# Patient Record
Sex: Female | Born: 1960 | Race: White | Hispanic: No | Marital: Married | Smoking: Never smoker
Health system: Southern US, Community
[De-identification: ages and names within clinical notes are randomized; demographics above are authoritative.]

## PROBLEM LIST (undated history)

## (undated) DIAGNOSIS — F419 Anxiety disorder, unspecified: Secondary | ICD-10-CM

## (undated) DIAGNOSIS — O009 Unspecified ectopic pregnancy without intrauterine pregnancy: Secondary | ICD-10-CM

## (undated) DIAGNOSIS — M722 Plantar fascial fibromatosis: Secondary | ICD-10-CM

## (undated) DIAGNOSIS — F329 Major depressive disorder, single episode, unspecified: Secondary | ICD-10-CM

## (undated) DIAGNOSIS — F32A Depression, unspecified: Secondary | ICD-10-CM

## (undated) DIAGNOSIS — F429 Obsessive-compulsive disorder, unspecified: Secondary | ICD-10-CM

## (undated) DIAGNOSIS — L409 Psoriasis, unspecified: Secondary | ICD-10-CM

## (undated) DIAGNOSIS — Z803 Family history of malignant neoplasm of breast: Secondary | ICD-10-CM

## (undated) HISTORY — DX: Psoriasis, unspecified: L40.9

## (undated) HISTORY — DX: Major depressive disorder, single episode, unspecified: F32.9

## (undated) HISTORY — DX: Family history of malignant neoplasm of breast: Z80.3

## (undated) HISTORY — DX: Depression, unspecified: F32.A

## (undated) HISTORY — PX: WISDOM TOOTH EXTRACTION: SHX21

## (undated) HISTORY — DX: Unspecified ectopic pregnancy without intrauterine pregnancy: O00.90

## (undated) HISTORY — DX: Obsessive-compulsive disorder, unspecified: F42.9

---
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
## 1992-10-30 DIAGNOSIS — O009 Unspecified ectopic pregnancy without intrauterine pregnancy: Secondary | ICD-10-CM

## 1992-10-30 HISTORY — DX: Unspecified ectopic pregnancy without intrauterine pregnancy: O00.90

## 1992-10-30 HISTORY — PX: ECTOPIC PREGNANCY SURGERY: SHX613

## 1998-11-04 ENCOUNTER — Other Ambulatory Visit: Admission: RE | Admit: 1998-11-04 | Discharge: 1998-11-04 | Payer: Self-pay | Admitting: *Deleted

## 2000-02-22 ENCOUNTER — Other Ambulatory Visit: Admission: RE | Admit: 2000-02-22 | Discharge: 2000-02-22 | Payer: Self-pay | Admitting: *Deleted

## 2001-03-04 ENCOUNTER — Other Ambulatory Visit: Admission: RE | Admit: 2001-03-04 | Discharge: 2001-03-04 | Payer: Self-pay | Admitting: Obstetrics and Gynecology

## 2002-04-16 ENCOUNTER — Other Ambulatory Visit: Admission: RE | Admit: 2002-04-16 | Discharge: 2002-04-16 | Payer: Self-pay | Admitting: Obstetrics and Gynecology

## 2003-05-27 ENCOUNTER — Other Ambulatory Visit: Admission: RE | Admit: 2003-05-27 | Discharge: 2003-05-27 | Payer: Self-pay | Admitting: Obstetrics and Gynecology

## 2004-06-30 ENCOUNTER — Other Ambulatory Visit: Admission: RE | Admit: 2004-06-30 | Discharge: 2004-06-30 | Payer: Self-pay | Admitting: Obstetrics and Gynecology

## 2005-08-11 ENCOUNTER — Other Ambulatory Visit: Admission: RE | Admit: 2005-08-11 | Discharge: 2005-08-11 | Payer: Self-pay | Admitting: Obstetrics and Gynecology

## 2006-10-11 ENCOUNTER — Ambulatory Visit: Payer: Self-pay | Admitting: Obstetrics & Gynecology

## 2006-10-30 HISTORY — PX: CHOLECYSTECTOMY: SHX55

## 2007-02-06 ENCOUNTER — Emergency Department: Payer: Self-pay | Admitting: Emergency Medicine

## 2007-02-22 ENCOUNTER — Ambulatory Visit: Payer: Self-pay | Admitting: General Surgery

## 2007-11-26 ENCOUNTER — Ambulatory Visit: Payer: Self-pay | Admitting: Obstetrics & Gynecology

## 2009-03-16 ENCOUNTER — Ambulatory Visit: Payer: Self-pay

## 2009-03-23 ENCOUNTER — Ambulatory Visit: Payer: Self-pay

## 2010-04-01 ENCOUNTER — Ambulatory Visit: Payer: Self-pay

## 2011-05-26 ENCOUNTER — Ambulatory Visit: Payer: Self-pay | Admitting: Unknown Physician Specialty

## 2011-10-31 DIAGNOSIS — Z803 Family history of malignant neoplasm of breast: Secondary | ICD-10-CM

## 2011-10-31 HISTORY — DX: Family history of malignant neoplasm of breast: Z80.3

## 2012-05-31 ENCOUNTER — Ambulatory Visit: Payer: Self-pay

## 2013-06-03 ENCOUNTER — Ambulatory Visit: Payer: Self-pay

## 2013-07-26 LAB — HM COLONOSCOPY

## 2014-01-21 ENCOUNTER — Encounter: Payer: Self-pay | Admitting: Podiatry

## 2014-01-21 ENCOUNTER — Ambulatory Visit (INDEPENDENT_AMBULATORY_CARE_PROVIDER_SITE_OTHER): Payer: BC Managed Care – PPO

## 2014-01-21 ENCOUNTER — Ambulatory Visit (INDEPENDENT_AMBULATORY_CARE_PROVIDER_SITE_OTHER): Payer: BC Managed Care – PPO | Admitting: Podiatry

## 2014-01-21 VITALS — BP 129/83 | HR 71 | Resp 18 | Ht 63.0 in | Wt 147.0 lb

## 2014-01-21 DIAGNOSIS — M722 Plantar fascial fibromatosis: Secondary | ICD-10-CM

## 2014-01-21 MED ORDER — METHYLPREDNISOLONE (PAK) 4 MG PO TABS: ORAL_TABLET | ORAL | Status: DC

## 2014-01-21 MED ORDER — MELOXICAM 15 MG PO TABS: 15.0000 mg | ORAL_TABLET | Freq: Every day | ORAL | Status: DC

## 2014-01-21 NOTE — Progress Notes (Signed)
   Subjective:    Patient ID: Natasha HanksStacy Deeley, female    DOB: [birth year], 53 y.o.   MRN: [id number]  HPI Comments: Right foot and heel pain. It is a throb to a dull ache . Started in November. Mornings can be worse. i run three to four times a week. Ive changed shoes rolled foot on a golf ball  Foot Pain Associated symptoms include coughing and a sore throat.      Review of Systems  HENT: Positive for sore throat.   Respiratory: Positive for cough.   Gastrointestinal: Positive for constipation.  All other systems reviewed and are negative.       Objective:   Physical Exam: I have reviewed her past mental history medications allergies surgeries and social history. Review systems is unremarkable. Pulses are strongly palpable bilateral. Muscle strength is 5 over 5 dorsiflexors plantar flexors inverters everters all intrinsic musculature is intact. Neurologic sensorium is intact per Semmes-Weinstein monofilament. Deep tendon reflexes are intact bilateral and brisk. Orthopedic evaluation demonstrates all joints distal to the ankle a full range of motion without crepitation. She has pain on palpation of the plantar medial calcaneal tubercle at the plantar fascial calcaneal insertion site right foot. Cutaneous evaluation demonstrates no erythema edema cellulitis drainage or odor.  Radiographic evaluation does demonstrate a soft tissue increase in density at the plantar fascial calcaneal insertion site.        Assessment & Plan:  Assessment: Plantar fasciitis right.  Plan: Discussed etiology pathology conservative versus surgical therapies. Injected the right heel today with Kenalog and local anesthetic. Wrote her prescription for Medrol Dosepak to be followed by Monday. Put her in a plantar fascial brace to be worn daily. She was provided with a plantar fascial night splint to be worn at night time. We discussed appropriate shoe gear stretching exercises ice therapy and shoe gear modifications.  I will followup with her in one month.

## 2014-01-28 HISTORY — PX: COLONOSCOPY: SHX174

## 2014-02-18 ENCOUNTER — Encounter: Payer: Self-pay | Admitting: Podiatry

## 2014-02-18 ENCOUNTER — Ambulatory Visit (INDEPENDENT_AMBULATORY_CARE_PROVIDER_SITE_OTHER): Payer: BC Managed Care – PPO | Admitting: Podiatry

## 2014-02-18 VITALS — BP 137/85 | HR 69 | Resp 16

## 2014-02-18 DIAGNOSIS — M722 Plantar fascial fibromatosis: Secondary | ICD-10-CM

## 2014-02-18 NOTE — Progress Notes (Signed)
She presents today for followup of her plantar fasciitis she states it is anywhere from 6070% improved. She doesn't have pain in the morning when she wakes up anymore however it does get tired throughout the day. She really can't say whether she has pain at some point every day or not.  Objective: Vital signs are stable she is alert and oriented x3. Pulses are strongly palpable right foot. She has pain on palpation medial continued tubercle of the right heel much less than previously noted.  Assessment: Residual plantar fasciitis right foot.  Plan: Injected the right heel again today continue all conservative therapies consider orthotics next visit

## 2014-03-04 ENCOUNTER — Telehealth: Payer: Self-pay | Admitting: *Deleted

## 2014-03-04 NOTE — Telephone Encounter (Signed)
Please call.

## 2014-03-04 NOTE — Telephone Encounter (Signed)
Have been having more pain in the left foot now. Wearing sneakers and still exercising. She thought that maybe switching up tennis shoes and taking a break from exercising will help. i agreed with Stellar . And she is going to try to do that

## 2014-03-04 NOTE — Telephone Encounter (Signed)
Called and left message for pt to return call. i do not know why pt called.

## 2014-03-19 ENCOUNTER — Ambulatory Visit: Payer: BC Managed Care – PPO | Admitting: Podiatry

## 2014-07-08 ENCOUNTER — Ambulatory Visit: Payer: Self-pay

## 2015-06-27 ENCOUNTER — Emergency Department (HOSPITAL_COMMUNITY)
Admission: EM | Admit: 2015-06-27 | Discharge: 2015-06-28 | Disposition: A | Discharge status: Other Acute Inpatient Hospital | Payer: BC Managed Care – PPO | Attending: Emergency Medicine | Admitting: Emergency Medicine

## 2015-06-27 ENCOUNTER — Encounter (HOSPITAL_COMMUNITY): Payer: Self-pay | Admitting: Nurse Practitioner

## 2015-06-27 DIAGNOSIS — R45851 Suicidal ideations: Secondary | ICD-10-CM | POA: Diagnosis present

## 2015-06-27 DIAGNOSIS — F1310 Sedative, hypnotic or anxiolytic abuse, uncomplicated: Secondary | ICD-10-CM | POA: Insufficient documentation

## 2015-06-27 DIAGNOSIS — F329 Major depressive disorder, single episode, unspecified: Secondary | ICD-10-CM | POA: Diagnosis not present

## 2015-06-27 DIAGNOSIS — Z791 Long term (current) use of non-steroidal anti-inflammatories (NSAID): Secondary | ICD-10-CM | POA: Diagnosis not present

## 2015-06-27 DIAGNOSIS — Z3202 Encounter for pregnancy test, result negative: Secondary | ICD-10-CM | POA: Diagnosis not present

## 2015-06-27 HISTORY — DX: Plantar fascial fibromatosis: M72.2

## 2015-06-27 HISTORY — DX: Anxiety disorder, unspecified: F41.9

## 2015-06-27 LAB — COMPREHENSIVE METABOLIC PANEL
ALT: 15 U/L (ref 14–54)
ANION GAP: 6 (ref 5–15)
AST: 20 U/L (ref 15–41)
Albumin: 4 g/dL (ref 3.5–5.0)
Alkaline Phosphatase: 47 U/L (ref 38–126)
BUN: 16 mg/dL (ref 6–20)
CHLORIDE: 105 mmol/L (ref 101–111)
CO2: 31 mmol/L (ref 22–32)
Calcium: 10.3 mg/dL (ref 8.9–10.3)
Creatinine, Ser: 0.98 mg/dL (ref 0.44–1.00)
GFR calc non Af Amer: >60 mL/min (ref >60)
Glucose, Bld: 91 mg/dL (ref 65–99)
Potassium: 4.2 mmol/L (ref 3.5–5.1)
SODIUM: 142 mmol/L (ref 135–145)
Total Bilirubin: 0.2 mg/dL — ABNORMAL LOW (ref 0.3–1.2)
Total Protein: 7.5 g/dL (ref 6.5–8.1)

## 2015-06-27 LAB — CBC
HCT: 41.7 % (ref 36.0–46.0)
HEMOGLOBIN: 14 g/dL (ref 12.0–15.0)
MCH: 29.1 pg (ref 26.0–34.0)
MCHC: 33.6 g/dL (ref 30.0–36.0)
MCV: 86.7 fL (ref 78.0–100.0)
Platelets: 222 10*3/uL (ref 150–400)
RBC: 4.81 MIL/uL (ref 3.87–5.11)
RDW: 13.2 % (ref 11.5–15.5)
WBC: 5.5 10*3/uL (ref 4.0–10.5)

## 2015-06-27 LAB — RAPID URINE DRUG SCREEN, HOSP PERFORMED
AMPHETAMINES: NOT DETECTED
Barbiturates: NOT DETECTED
Benzodiazepines: POSITIVE — AB
COCAINE: NOT DETECTED
OPIATES: NOT DETECTED
TETRAHYDROCANNABINOL: NOT DETECTED

## 2015-06-27 LAB — SALICYLATE LEVEL

## 2015-06-27 LAB — ETHANOL: Alcohol, Ethyl (B): <5 mg/dL (ref <5)

## 2015-06-27 LAB — I-STAT BETA HCG BLOOD, ED (MC, WL, AP ONLY): I-stat hCG, quantitative: <5 m[IU]/mL (ref <5)

## 2015-06-27 LAB — ACETAMINOPHEN LEVEL

## 2015-06-27 MED ORDER — ONDANSETRON HCL 4 MG PO TABS: 4.0000 mg | ORAL_TABLET | Freq: Three times a day (TID) | ORAL | Status: DC | PRN

## 2015-06-27 MED ORDER — ACETAMINOPHEN 325 MG PO TABS: 650.0000 mg | ORAL_TABLET | ORAL | Status: DC | PRN

## 2015-06-27 MED ORDER — FLUVOXAMINE MALEATE 100 MG PO TABS
100.0000 mg | ORAL_TABLET | Freq: Every day | ORAL | Status: DC
  Administered 2015-06-27: 100 mg via ORAL
  Filled 2015-06-27 (×3): qty 1

## 2015-06-27 MED ORDER — ALPRAZOLAM 0.5 MG PO TABS
0.5000 mg | ORAL_TABLET | Freq: Two times a day (BID) | ORAL | Status: DC
  Administered 2015-06-27 – 2015-06-28 (×2): 0.5 mg via ORAL
  Filled 2015-06-27 (×2): qty 1

## 2015-06-27 MED ORDER — BUSPIRONE HCL 10 MG PO TABS
30.0000 mg | ORAL_TABLET | Freq: Two times a day (BID) | ORAL | Status: DC
  Administered 2015-06-27 – 2015-06-28 (×2): 30 mg via ORAL
  Filled 2015-06-27 (×2): qty 3

## 2015-06-27 MED ORDER — VENLAFAXINE HCL ER 37.5 MG PO CP24
37.5000 mg | ORAL_CAPSULE | Freq: Every day | ORAL | Status: DC
  Filled 2015-06-27: qty 1

## 2015-06-27 MED ORDER — IBUPROFEN 400 MG PO TABS: 600.0000 mg | ORAL_TABLET | Freq: Three times a day (TID) | ORAL | Status: DC | PRN

## 2015-06-27 MED ORDER — ALUM & MAG HYDROXIDE-SIMETH 200-200-20 MG/5ML PO SUSP: 30.0000 mL | ORAL | Status: DC | PRN

## 2015-06-27 MED ORDER — ZOLPIDEM TARTRATE 5 MG PO TABS: 5.0000 mg | ORAL_TABLET | Freq: Every evening | ORAL | Status: DC | PRN

## 2015-06-27 NOTE — ED Notes (Signed)
TTS being performed.  

## 2015-06-27 NOTE — Progress Notes (Signed)
Counselor consulted with Serena Colonel, NP who states that patient meets criteria for inpatient treatment at this time. TTS to seek placement.

## 2015-06-27 NOTE — ED Notes (Signed)
Dinner tray arrived, patient eating; sitter at bedside 

## 2015-06-27 NOTE — ED Notes (Addendum)
Pt arrived to [facility] - dressed in maroon colored scrubs. States Shugart w/[facility] is her psych - last visit was 06/16/15 - states Xanax dosage was changed. States has SI plan - OD or sit in running vehicle in garage. 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

## 2015-06-27 NOTE — Progress Notes (Signed)
Received report from RN. RN states patient is being moved to Pod C. Counselor request that RN notify RN in [place] C to contact [facility] once patient is moved in order to initiate assessment.

## 2015-06-27 NOTE — ED Notes (Signed)
Patient placed in paper scrubs. Patient wanded by security.

## 2015-06-27 NOTE — BH Assessment (Signed)
Tele Assessment Note   Natasha Kennedy is an 54 y.o. female who presents voluntarily to [facility] emergency department with the chief complaint of suicidal ideations and plan to overdose or sit in garage with vehicle running. Patient is a 1st grade teacher and reports severe anxiety in regard to school starting tomorrow and not feeling fully prepared in regard to lesson plans and other additional requirements. Patient shares that she is currently connected with an outpatient psychiatric provider by the name of Anne Fu and states that she is currently taking Buspar, Xanax, and Luvox. Patient reports that her Xanax was recently changed during her outpatient appointment on August 17th from 0.25mg  to 0.50mg  twice a day. Patient shares that this medication adjustment provided no alleviation of her anxiety and depressive symptoms. Patient reports that she has been experiencing thoughts of suicide for the past 2 weeks. Patient does endorse a decrease appetite AEB a 14 pound weight lost since June 2016 in addition to disturbances within her sleep patterns. Patient denies any previous inpatient treatment at this time and identifies her husband to be a source of support. Patient denies HI/AVH.    Axis I: Generalized Anxiety Disorder, Major Depression, Recurrent severe and Obsessive Compulsive Disorder  Past Medical History:  Past Medical History  Diagnosis Date  . OCD (obsessive compulsive disorder)   . Depression   . Anxiety   . Plantar fasciitis of left foot     Past Surgical History  Procedure Laterality Date  . Cholecystectomy    . Abdominal surgery    . Rhinoplasty      Family History: History reviewed. No pertinent family history.  Social History:  reports that she has never smoked. She has never used smokeless tobacco. She reports that she does not drink alcohol or use illicit drugs.  Additional Social History:  Alcohol / Drug Use History of alcohol / drug use?: No history of alcohol /  drug abuse  CIWA: CIWA-Ar BP: 145/95 mmHg Pulse Rate: (!) 56 COWS:    PATIENT STRENGTHS: (choose at least two) Ability for insight Special hobby/interest  Allergies: No Known Allergies  Home Medications:  (Not in a hospital admission)  OB/GYN Status:  Patient's last menstrual period was 01/29/2015.  General Assessment Data Location of Assessment: [facility] ED TTS Assessment: In system Is this a Tele or Face-to-Face Assessment?: Tele Assessment Is this an Initial Assessment or a Re-assessment for this encounter?: Initial Assessment Marital status: Married Is patient pregnant?: No Pregnancy Status: No Living Arrangements: Spouse/significant other Can pt return to current living arrangement?: Yes Admission Status: Voluntary Is patient capable of signing voluntary admission?: Yes Referral Source: Self/Family/Friend     Crisis Care Plan Living Arrangements: Spouse/significant other Name of Psychiatrist: Anne Fu Name of Therapist: None   Education Status Is patient currently in school?: No  Risk to self with the past 6 months Suicidal Ideation: Yes-Currently Present Has patient been a risk to self within the past 6 months prior to admission? : No Suicidal Intent: Yes-Currently Present Has patient had any suicidal intent within the past 6 months prior to admission? : No Is patient at risk for suicide?: Yes Suicidal Plan?: No-Not Currently/Within Last 6 Months Has patient had any suicidal plan within the past 6 months prior to admission? : No Access to Means: Yes Specify Access to Suicidal Means: Access to pills What has been your use of drugs/alcohol within the last 12 months?: None Previous Attempts/Gestures: No How many times?: 0 Other Self Harm Risks: None Triggers  for Past Attempts: None known Intentional Self Injurious Behavior: None Family Suicide History: No Recent stressful life event(s): Other (Comment) (Job related stress) Persecutory voices/beliefs?:  No Depression: Yes Depression Symptoms: Feeling worthless/self pity, Fatigue, Tearfulness, Insomnia Substance abuse history and/or treatment for substance abuse?: No Suicide prevention information given to non-admitted patients: Not applicable  Risk to Others within the past 6 months Homicidal Ideation: No Does patient have any lifetime risk of violence toward others beyond the six months prior to admission? : No Thoughts of Harm to Others: No Current Homicidal Intent: No Current Homicidal Plan: No Access to Homicidal Means: No Identified Victim: None History of harm to others?: No Assessment of Violence: None Noted Violent Behavior Description: Patient is calm and cooperative Does patient have access to weapons?: No Criminal Charges Pending?: No Does patient have a court date: No Is patient on probation?: No  Psychosis Hallucinations: None noted Delusions: None noted  Mental Status Report Appearance/Hygiene: In hospital gown Eye Contact: Good Motor Activity: Unremarkable Speech: Logical/coherent Level of Consciousness: Alert Mood: Depressed, Anxious, Helpless Affect: Depressed, Sad Anxiety Level: Severe Thought Processes: Coherent, Relevant Judgement: Partial Orientation: Person, Place, Time, Situation Obsessive Compulsive Thoughts/Behaviors: None  Cognitive Functioning Concentration: Decreased Memory: Recent Intact, Remote Intact IQ: Average Insight: Fair Impulse Control: Fair Appetite: Poor Weight Loss: 14 Weight Gain: 0 Sleep: Decreased Total Hours of Sleep: 7 Vegetative Symptoms: None  ADLScreening [facility] Assessment Services) Patient's cognitive ability adequate to safely complete daily activities?: Yes Patient able to express need for assistance with ADLs?: Yes Independently performs ADLs?: Yes (appropriate for developmental age)  Prior Inpatient Therapy Prior Inpatient Therapy: No  Prior Outpatient Therapy Prior Outpatient Therapy: Yes Prior Therapy  Dates: Current Prior Therapy Facilty/Provider(s): Anne Fu Reason for Treatment: Medication mgmt Does patient have an ACCT team?: No Does patient have Intensive In-House Services?  : No Does patient have [facility] services? : No Does patient have P4CC services?: No  ADL Screening (condition at time of admission) Patient's cognitive ability adequate to safely complete daily activities?: Yes Is the patient deaf or have difficulty hearing?: No Does the patient have difficulty seeing, even when wearing glasses/contacts?: No Does the patient have difficulty concentrating, remembering, or making decisions?: No Patient able to express need for assistance with ADLs?: Yes Does the patient have difficulty dressing or bathing?: No Independently performs ADLs?: Yes (appropriate for developmental age) Does the patient have difficulty walking or climbing stairs?: No Weakness of Legs: None Weakness of Arms/Hands: None  Home Assistive Devices/Equipment Home Assistive Devices/Equipment: None  Therapy Consults (therapy consults require a physician order) PT Evaluation Needed: No OT Evalulation Needed: No SLP Evaluation Needed: No Abuse/Neglect Assessment (Assessment to be complete while patient is alone) Physical Abuse: Denies Verbal Abuse: Denies Sexual Abuse: Denies Exploitation of patient/patient's resources: Denies Self-Neglect: Denies Values / Beliefs Cultural Requests During Hospitalization: None Spiritual Requests During Hospitalization: None Consults Spiritual Care Consult Needed: No Social Work Consult Needed: No Merchant navy officer (For Healthcare) Does patient have an advance directive?: No Would patient like information on creating an advanced directive?: No - patient declined information    Additional Information 1:1 In Past 12 Months?: No CIRT Risk: No Elopement Risk: No Does patient have medical clearance?: Yes     Disposition:  Disposition Initial Assessment  Completed for this Encounter: Yes  PICKETT JR, Timonthy Hovater C 06/27/2015 5:22 PM

## 2015-06-27 NOTE — ED Notes (Signed)
Called service response and ordered patient a dinner tray

## 2015-06-27 NOTE — BH Assessment (Signed)
Contacted the following facilities for placement:  AT [facility]: [facility], per [facility], per [facility], per [facility], per [facility], per [facility], per [facility] per [facility], per [facility], per [facility], per [facility], per [facility], per [facility], per [facility], per [facility] per [facility], per [facility] per [facility], per [facility], per [facility], per [facility], per [facility], per [facility], [facility], [facility], [facility] Triage Specialist [phone]

## 2015-06-27 NOTE — ED Notes (Signed)
She states she has been having suicidal thoughts for 2 weeks, she has medications for depression/anxiety that she has been taking as scheduled. She reports she did think of a plan for suicide. Denies any thoughts of harming others. She denies any substance use, physical complaints. She is A&Ox4, resp e/u

## 2015-06-27 NOTE — ED Notes (Signed)
Ps given graham crackers and a sprite with ice.

## 2015-06-27 NOTE — ED Notes (Signed)
Natasha Kennedy, [facility], completing TTS via phone d/t telepsych machine not working properly. Pt voiced she was OK w/it.

## 2015-06-27 NOTE — ED Provider Notes (Signed)
CSN: [id number]     Arrival date & time 06/27/15  1339 History   First MD Initiated Contact with Patient 06/27/15 1541     Chief Complaint  Patient presents with  . Suicidal     (Consider location/radiation/quality/duration/timing/severity/associated sxs/prior Treatment) HPI Patient presents to the emergency department with suicidal thoughts.  The patient states that she has had increasing suicidal thoughts over the past 2 weeks.  She states that she does take medications for depression and anxiety states her doctor saw her several weeks ago and asked if she would like to be seen by someone at that time, but she denied that request.  The patient states that her thoughts about more significant over the last week.  She states that there is no inciting event.  Patient denies hallucinations, nausea, vomiting, weakness, dizziness, headache, blurred vision, chest pain, shortness of breath, back pain, neck pain, fever, cough, runny nose, sore throat, or syncope.  She states she does not have any homicidal ideations Past Medical History  Diagnosis Date  . OCD (obsessive compulsive disorder)   . Depression    Past Surgical History  Procedure Laterality Date  . Cholecystectomy    . Abdominal surgery    . Rhinoplasty     History reviewed. No pertinent family history. Social History  Substance Use Topics  . Smoking status: Never Smoker   . Smokeless tobacco: Never Used  . Alcohol Use: No   OB History    No data available     Review of Systems All other systems negative except as documented in the HPI. All pertinent positives and negatives as reviewed in the HPI.   Allergies  Review of patient's allergies indicates no known allergies.  Home Medications   Prior to Admission medications   Medication Sig Start Date End Date Taking? Authorizing Provider  ALPRAZolam Prudy Feeler) 0.5 MG tablet  01/10/14   Historical Provider, MD  fluvoxaMINE (LUVOX) 100 MG tablet  01/28/14   Historical Provider,  MD  meloxicam (MOBIC) 15 MG tablet Take 1 tablet (15 mg total) by mouth daily. 01/21/14   Max T Hyatt, DPM  methylPREDNIsolone (MEDROL DOSPACK) 4 MG tablet follow package directions 01/21/14   Max T Hyatt, DPM  venlafaxine XR (EFFEXOR-XR) 37.5 MG 24 hr capsule  01/18/14   Historical Provider, MD   BP 160/83 mmHg  Pulse 66  Temp(Src) 98.3 F (36.8 C) (Oral)  Resp 14  Ht  (1.6 m)  Wt 126 lb (57.153 kg)  BMI 22.33 kg/m2  SpO2 99%  LMP 01/29/2015 Physical Exam  Constitutional: She is oriented to person, place, and time. She appears well-developed and well-nourished. No distress.  HENT:  Head: Normocephalic and atraumatic.  Mouth/Throat: Oropharynx is clear and moist.  Eyes: Pupils are equal, round, and reactive to light.  Neck: Normal range of motion. Neck supple.  Cardiovascular: Normal rate, regular rhythm and normal heart sounds.  Exam reveals no gallop and no friction rub.   No murmur heard. Pulmonary/Chest: Effort normal and breath sounds normal.  Neurological: She is alert and oriented to person, place, and time. She exhibits normal muscle tone. Coordination normal.  Skin: Skin is warm and dry. No rash noted. No erythema.  Psychiatric: Her speech is normal and behavior is normal. Judgment normal. Her mood appears not anxious. Thought content is not paranoid and not delusional. Cognition and memory are normal. She exhibits a depressed mood. She expresses suicidal ideation. She expresses no homicidal ideation. She expresses suicidal plans. She expresses no  homicidal plans.  Nursing note and vitals reviewed.   ED Course  Procedures (including critical care time) Labs Review Labs Reviewed  COMPREHENSIVE METABOLIC PANEL - Abnormal; Notable for the following:    Total Bilirubin 0.2 (*)    All other components within normal limits  ACETAMINOPHEN LEVEL - Abnormal; Notable for the following:    Acetaminophen (Tylenol), Serum <10 (*)    All other components within normal limits   ETHANOL  SALICYLATE LEVEL  CBC  URINE RAPID DRUG SCREEN, HOSP PERFORMED  I-STAT BETA HCG BLOOD, ED ([facility], [facility], [facility] ONLY)    Imaging Review No results found. I have personally reviewed and evaluated these images and lab results as part of my medical decision-making. Patient will need TTS assessment.  Patient agrees to this plan    Charlestine Night, PA-C 06/27/15 1621  Laurence Spates, MD 06/27/15 [phone]

## 2015-06-27 NOTE — ED Notes (Signed)
Patient brought back to room with sitter and visitor in tow; Alexander, RN in with patient and visitor explaining the rules for Pod C; sitter at door

## 2015-06-27 NOTE — ED Notes (Signed)
Pt signed consent form to release info to her spouse - Cindi Ghazarian - [phone], her daughter - Leafy Half, her son - Gisela Lea and her mother - Gweneth Fritter - [phone].

## 2015-06-28 ENCOUNTER — Inpatient Hospital Stay (HOSPITAL_COMMUNITY)
Admission: AD | Admit: 2015-06-28 | Discharge: 2015-06-28 | DRG: 885 | Disposition: A | Discharge status: Home / Self Care | Payer: BC Managed Care – PPO | Source: Intra-hospital | Attending: Psychiatry | Admitting: Psychiatry

## 2015-06-28 ENCOUNTER — Encounter (HOSPITAL_COMMUNITY): Payer: Self-pay | Admitting: *Deleted

## 2015-06-28 DIAGNOSIS — F329 Major depressive disorder, single episode, unspecified: Secondary | ICD-10-CM | POA: Diagnosis present

## 2015-06-28 DIAGNOSIS — F322 Major depressive disorder, single episode, severe without psychotic features: Secondary | ICD-10-CM

## 2015-06-28 DIAGNOSIS — R45851 Suicidal ideations: Secondary | ICD-10-CM | POA: Diagnosis present

## 2015-06-28 DIAGNOSIS — F42 Obsessive-compulsive disorder: Secondary | ICD-10-CM

## 2015-06-28 DIAGNOSIS — F429 Obsessive-compulsive disorder, unspecified: Secondary | ICD-10-CM | POA: Diagnosis present

## 2015-06-28 DIAGNOSIS — F332 Major depressive disorder, recurrent severe without psychotic features: Principal | ICD-10-CM | POA: Diagnosis present

## 2015-06-28 DIAGNOSIS — G4700 Insomnia, unspecified: Secondary | ICD-10-CM | POA: Diagnosis present

## 2015-06-28 DIAGNOSIS — F418 Other specified anxiety disorders: Secondary | ICD-10-CM | POA: Diagnosis present

## 2015-06-28 MED ORDER — FLUVOXAMINE MALEATE ER 150 MG PO CP24: 150.0000 mg | ORAL_CAPSULE | Freq: Two times a day (BID) | ORAL | Status: DC

## 2015-06-28 MED ORDER — ARIPIPRAZOLE 2 MG PO TABS: 2.0000 mg | ORAL_TABLET | Freq: Every day | ORAL | Status: DC

## 2015-06-28 MED ORDER — BUSPIRONE HCL 30 MG PO TABS: 30.0000 mg | ORAL_TABLET | Freq: Two times a day (BID) | ORAL | Status: DC

## 2015-06-28 MED ORDER — MAGNESIUM HYDROXIDE 400 MG/5ML PO SUSP: 30.0000 mL | Freq: Every day | ORAL | Status: DC | PRN

## 2015-06-28 MED ORDER — HYDROXYZINE HCL 25 MG PO TABS: 25.0000 mg | ORAL_TABLET | ORAL | Status: DC | PRN

## 2015-06-28 MED ORDER — BUSPIRONE HCL 15 MG PO TABS
30.0000 mg | ORAL_TABLET | Freq: Two times a day (BID) | ORAL | Status: DC
  Administered 2015-06-28: 30 mg via ORAL
  Filled 2015-06-28 (×5): qty 2

## 2015-06-28 MED ORDER — ACETAMINOPHEN 325 MG PO TABS: 650.0000 mg | ORAL_TABLET | Freq: Four times a day (QID) | ORAL | Status: DC | PRN

## 2015-06-28 MED ORDER — ARIPIPRAZOLE 2 MG PO TABS
2.0000 mg | ORAL_TABLET | Freq: Every day | ORAL | Status: DC
  Administered 2015-06-28: 2 mg via ORAL
  Filled 2015-06-28 (×3): qty 1

## 2015-06-28 MED ORDER — ALUM & MAG HYDROXIDE-SIMETH 200-200-20 MG/5ML PO SUSP: 30.0000 mL | ORAL | Status: DC | PRN

## 2015-06-28 MED ORDER — ALPRAZOLAM 0.5 MG PO TABS: 0.5000 mg | ORAL_TABLET | Freq: Two times a day (BID) | ORAL | Status: DC

## 2015-06-28 MED ORDER — TRAZODONE HCL 50 MG PO TABS
50.0000 mg | ORAL_TABLET | Freq: Every day | ORAL | Status: DC
  Filled 2015-06-28 (×2): qty 1

## 2015-06-28 MED ORDER — FLUVOXAMINE MALEATE 50 MG PO TABS
150.0000 mg | ORAL_TABLET | Freq: Every day | ORAL | Status: DC
  Filled 2015-06-28 (×2): qty 1

## 2015-06-28 MED ORDER — TRAZODONE HCL 50 MG PO TABS: 50.0000 mg | ORAL_TABLET | Freq: Every day | ORAL | Status: DC

## 2015-06-28 NOTE — Tx Team (Signed)
Initial Interdisciplinary Treatment Plan   PATIENT STRESSORS: Medication change or noncompliance   PATIENT STRENGTHS: Ability for insight Average or above average intelligence Capable of independent living Communication skills General fund of knowledge Physical Health Religious Affiliation Special hobby/interest Supportive family/friends   PROBLEM LIST: Problem List/Patient Goals Date to be addressed Date deferred Reason deferred Estimated date of resolution  anxiety 06/28/15     si thoughts 06/28/15                                                DISCHARGE CRITERIA:  Ability to meet basic life and health needs Adequate post-discharge living arrangements Improved stabilization in mood, thinking, and/or behavior Motivation to continue treatment in a less acute level of care Need for constant or close observation no longer present Reduction of life-threatening or endangering symptoms to within safe limits Safe-care adequate arrangements made Verbal commitment to aftercare and medication compliance  PRELIMINARY DISCHARGE PLAN: Outpatient therapy Return to previous living arrangement Return to previous work or school arrangements  PATIENT/FAMIILY INVOLVEMENT: This treatment plan has been presented to and reviewed with the patient, Natasha Kennedy, and/or family member,   The patient and family have been given the opportunity to ask questions and make suggestions.  Beatrix Shipper 06/28/2015, 11:34 AM

## 2015-06-28 NOTE — H&P (Signed)
Psychiatric Admission Assessment Adult  Patient Identification: Hania Cerone MRN:  [id number] Date of Evaluation:  06/28/2015 Chief Complaint:  MDD Principal Diagnosis: <principal problem not specified> Diagnosis:   Patient Active Problem List   Diagnosis Date Noted  . MDD (major depressive disorder) [F32.2] 06/28/2015   History of Present Illness:: states she has been having some suicidal thoughts over the last 2 weeks. States she came to the ED to be evaluated. She states she started having OCD symptoms when she was pregnant with her son who is now 65. States her OCD is well controlled with the Luvox. 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 States it was stressful as she was by herself without a team of teachers. 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 . States she was on Xanax 0.5 mg BID for 10 years. States she was wanting to come off the Xanax. She worked on decreasing it to 0.25 BID. The anxiety got worst when she went down to this dose. She went back to the full dose 2 weeks ago. States she has continued to have a lot of anxiety. as she got ready to start school and went to the meetings and she started listening to the expectations requirements etc got more upset.  States she was supporsed to start school today. Yesterday she felt she overwhelmed. For the last 2 weeks she has had thoughts of suicide. She goes over different scenarios. But states that thinking about her family would keep her from acting out. She currently denies any suicidal thoughts.  Elements:  Location:  depression anxiety. Quality:  as starting school go nearer and she was experiencing increased anxiety going off the Xanax experienced SI with no intent. Severity:  moderate. Timing:  daily on and off . Duration:  2 weeks. Associated Signs/Symptoms: Depression Symptoms:  depressed mood, anxiety, weight  loss, decreased appetite, (Hypo) Manic Symptoms:  denies Anxiety Symptoms:  OCD, ruminative thoughts that she new were irrational  Psychotic Symptoms:  denies PTSD Symptoms: Negative Total Time spent with patient: 45 minutes  Past Medical History:  Past Medical History  Diagnosis Date  . OCD (obsessive compulsive disorder)   . Depression   . Anxiety   . Plantar fasciitis of left foot     Past Surgical History  Procedure Laterality Date  . Cholecystectomy    . Abdominal surgery    . Rhinoplasty     Family History: History reviewed. No pertinent family history.  Grandmother: OCD, depression brother OCD Social History:  History  Alcohol Use No    Comment: Patient denies      History  Drug Use No    Comment: Patient denies     Social History   Social History  . Marital Status: Married    Spouse Name: N/A  . Number of Children: N/A  . Years of Education: N/A   Social History Main Topics  . Smoking status: Never Smoker   . Smokeless tobacco: Never Used  . Alcohol Use: No     Comment: Patient denies   . Drug Use: No     Comment: Patient denies   . Sexual Activity: Yes    Birth Control/ Protection: Condom   Other Topics Concern  . None   Social History Narrative  Living with husband and a 38 Y/O son,  Has a 73 Y/O daughter has a grandchild. Lost 2 pregnancies  in between her two kids. [employer] in [place]. She went back to school. Music teacher taught orchestra for 17 years, then back to college to get her degree in elementary education. Additional Social History:                          Musculoskeletal: Strength & Muscle Tone: within normal limits Gait & Station: normal Patient leans: normal  Psychiatric Specialty Exam: Physical Exam  Review of Systems  Constitutional: Positive for malaise/fatigue.  HENT:       Frontal pressure  Respiratory: Negative.   Cardiovascular: Negative.   Gastrointestinal: Negative.   Musculoskeletal:  Negative.   Neurological: Positive for weakness and headaches.  Endo/Heme/Allergies: Negative.   Psychiatric/Behavioral: Positive for depression. The patient is nervous/anxious.     Blood pressure 125/93, pulse 81, temperature 98.1 F (36.7 C), temperature source Oral, resp. rate 16, height _0  (1.575 m), weight 56.881 kg (125 lb 6.4 oz), last menstrual period 01/29/2015.Body mass index is 22.93 kg/(m^2).  General Appearance: Fairly Groomed  Engineer, water::  Fair  Speech:  Clear and Coherent  Volume:  Normal  Mood:  Anxious and worried, states that she does not need to be here  Affect:  anxious worried  Thought Process:  Coherent and Goal Directed  Orientation:  Full (Time, Place, and Person)  Thought Content:  symptoms events worries concerns  Suicidal Thoughts:  No states she currently does not have any suicidal thoughts and thinking about her family would be a deterrent   Homicidal Thoughts:  No  Memory:  Immediate;   Fair Recent;   Fair Remote;   Fair  Judgement:  Fair  Insight:  Present  Psychomotor Activity:  Normal  Concentration:  Fair  Recall:  [employer] of Knowledge:Fair  Language: Fair  Akathisia:  No  Handed:  Right  AIMS (if indicated):     Assets:  Desire for Improvement Housing Physical Health Social Support Transportation Vocational/Educational  ADL's:  Intact  Cognition: WNL  Sleep:      Risk to Self: Is patient at risk for suicide?: Yes Risk to Others:   Prior Inpatient Therapy:  Denies Prior Outpatient Therapy:  [facility], sees Novella Olive for the last 2 years. Has seen Rosary Lively   Alcohol Screening: 1. How often do you have a drink containing alcohol?: Never 9. Have you or someone else been injured as a result of your drinking?: No 10. Has a relative or friend or a doctor or another health worker been concerned about your drinking or suggested you cut down?: No Alcohol Use Disorder Identification Test Final Score (AUDIT): 0 Brief  Intervention: AUDIT score less than 7 or less-screening does not suggest unhealthy drinking-brief intervention not indicated  Allergies:  No Known Allergies Lab Results:  Results for orders placed or performed during the hospital encounter of 06/27/15 (from the past 48 hour(s))  Comprehensive metabolic panel     Status: Abnormal   Collection Time: 06/27/15  2:18 PM  Result Value Ref Range   Sodium 142 135 - 145 mmol/L   Potassium 4.2 3.5 - 5.1 mmol/L   Chloride 105 101 - 111 mmol/L   CO2 31 22 - 32 mmol/L   Glucose, Bld 91 65 - 99 mg/dL   BUN 16 6 - 20 mg/dL   Creatinine, Ser 0.98 0.44 - 1.00 mg/dL   Calcium 10.3 8.9 - 10.3 mg/dL   Total Protein 7.5 6.5 - 8.1 g/dL  Albumin 4.0 3.5 - 5.0 g/dL   AST 20 15 - 41 U/L   ALT 15 14 - 54 U/L   Alkaline Phosphatase 47 38 - 126 U/L   Total Bilirubin 0.2 (L) 0.3 - 1.2 mg/dL   GFR calc non Af Amer >60 >60 mL/min   GFR calc Af Amer >60 >60 mL/min    Comment: (NOTE) The eGFR has been calculated using the CKD EPI equation. This calculation has not been validated in all clinical situations. eGFR's persistently <60 mL/min signify possible Chronic Kidney Disease.    Anion gap 6 5 - 15  Ethanol (ETOH)     Status: None   Collection Time: 06/27/15  2:18 PM  Result Value Ref Range   Alcohol, Ethyl (B) <5 <5 mg/dL    Comment:        LOWEST DETECTABLE LIMIT FOR SERUM ALCOHOL IS 5 mg/dL FOR MEDICAL PURPOSES ONLY   Salicylate level     Status: None   Collection Time: 06/27/15  2:18 PM  Result Value Ref Range   Salicylate Lvl <7.5 2.8 - 30.0 mg/dL  Acetaminophen level     Status: Abnormal   Collection Time: 06/27/15  2:18 PM  Result Value Ref Range   Acetaminophen (Tylenol), Serum <10 (L) 10 - 30 ug/mL    Comment:        THERAPEUTIC CONCENTRATIONS VARY SIGNIFICANTLY. A RANGE OF 10-30 ug/mL MAY BE AN EFFECTIVE CONCENTRATION FOR MANY PATIENTS. HOWEVER, SOME ARE BEST TREATED AT CONCENTRATIONS OUTSIDE THIS RANGE. ACETAMINOPHEN  CONCENTRATIONS >150 ug/mL AT 4 HOURS AFTER INGESTION AND >50 ug/mL AT 12 HOURS AFTER INGESTION ARE OFTEN ASSOCIATED WITH TOXIC REACTIONS.   CBC     Status: None   Collection Time: 06/27/15  2:18 PM  Result Value Ref Range   WBC 5.5 4.0 - 10.5 K/uL   RBC 4.81 3.87 - 5.11 MIL/uL   Hemoglobin 14.0 12.0 - 15.0 g/dL   HCT 41.7 36.0 - 46.0 %   MCV 86.7 78.0 - 100.0 fL   MCH 29.1 26.0 - 34.0 pg   MCHC 33.6 30.0 - 36.0 g/dL   RDW 13.2 11.5 - 15.5 %   Platelets 222 150 - 400 K/uL  I-Stat beta hCG blood, ED ([facility], [facility], [facility] only)     Status: None   Collection Time: 06/27/15  2:34 PM  Result Value Ref Range   I-stat hCG, quantitative <5.0 <5 mIU/mL   Comment 3            Comment:   GEST. AGE      CONC.  (mIU/mL)   <=1 WEEK        5 - 50     2 WEEKS       50 - 500     3 WEEKS       100 - 10,000     4 WEEKS     1,000 - 30,000        FEMALE AND NON-PREGNANT FEMALE:     LESS THAN 5 mIU/mL   Urine rapid drug screen (hosp performed) (Not at [facility])     Status: Abnormal   Collection Time: 06/27/15  3:50 PM  Result Value Ref Range   Opiates NONE DETECTED NONE DETECTED   Cocaine NONE DETECTED NONE DETECTED   Benzodiazepines POSITIVE (A) NONE DETECTED   Amphetamines NONE DETECTED NONE DETECTED   Tetrahydrocannabinol NONE DETECTED NONE DETECTED   Barbiturates NONE DETECTED NONE DETECTED    Comment:  DRUG SCREEN FOR MEDICAL PURPOSES ONLY.  IF CONFIRMATION IS NEEDED FOR ANY PURPOSE, NOTIFY LAB WITHIN 5 DAYS.        LOWEST DETECTABLE LIMITS FOR URINE DRUG SCREEN Drug Class       Cutoff (ng/mL) Amphetamine      1000 Barbiturate      200 Benzodiazepine   950 Tricyclics       932 Opiates          300 Cocaine          300 THC              50    Current Medications: Current Facility-Administered Medications  Medication Dose Route Frequency Provider Last Rate Last Dose  . acetaminophen (TYLENOL) tablet 650 mg  650 mg Oral Q6H PRN Encarnacion Slates, NP      . alum & mag hydroxide-simeth  (MAALOX/MYLANTA) 200-200-20 MG/5ML suspension 30 mL  30 mL Oral Q4H PRN Encarnacion Slates, NP      . busPIRone (BUSPAR) tablet 30 mg  30 mg Oral BID Encarnacion Slates, NP      . hydrOXYzine (ATARAX/VISTARIL) tablet 25 mg  25 mg Oral Q4H PRN Encarnacion Slates, NP      . magnesium hydroxide (MILK OF MAGNESIA) suspension 30 mL  30 mL Oral Daily PRN Encarnacion Slates, NP      . traZODone (DESYREL) tablet 50 mg  50 mg Oral QHS Encarnacion Slates, NP       PTA Medications: Prescriptions prior to admission  Medication Sig Dispense Refill Last Dose  . ALPRAZolam (XANAX) 0.5 MG tablet Take 0.5 mg by mouth 2 (two) times daily.    06/27/2015 at Unknown time  . busPIRone (BUSPAR) 30 MG tablet Take 30 mg by mouth 2 (two) times daily.  0 06/27/2015 at Unknown time  . Fluvoxamine Maleate 150 MG CP24 Take 150 mg by mouth 2 (two) times daily.  0 06/27/2015 at Unknown time  . ibuprofen (ADVIL,MOTRIN) 200 MG tablet Take 400 mg by mouth 3 (three) times daily as needed for headache or moderate pain.   06/26/2015 at Unknown time  . Melatonin 5 MG TABS Take by mouth.   06/26/2015 at Unknown time  . meloxicam (MOBIC) 15 MG tablet Take 1 tablet (15 mg total) by mouth daily. (Patient not taking: Reported on 06/27/2015) 30 tablet 3 Not Taking at Unknown time    Previous Psychotropic Medications: Yes Luvox, Xanax Buspar, Effexor,   Substance Abuse History in the last 12 months:  No.    Consequences of Substance Abuse: Negative  Results for orders placed or performed during the hospital encounter of 06/27/15 (from the past 72 hour(s))  Comprehensive metabolic panel     Status: Abnormal   Collection Time: 06/27/15  2:18 PM  Result Value Ref Range   Sodium 142 135 - 145 mmol/L   Potassium 4.2 3.5 - 5.1 mmol/L   Chloride 105 101 - 111 mmol/L   CO2 31 22 - 32 mmol/L   Glucose, Bld 91 65 - 99 mg/dL   BUN 16 6 - 20 mg/dL   Creatinine, Ser 0.98 0.44 - 1.00 mg/dL   Calcium 10.3 8.9 - 10.3 mg/dL   Total Protein 7.5 6.5 - 8.1 g/dL   Albumin  4.0 3.5 - 5.0 g/dL   AST 20 15 - 41 U/L   ALT 15 14 - 54 U/L   Alkaline Phosphatase 47 38 - 126 U/L   Total Bilirubin 0.2 (L)  0.3 - 1.2 mg/dL   GFR calc non Af Amer >60 >60 mL/min   GFR calc Af Amer >60 >60 mL/min    Comment: (NOTE) The eGFR has been calculated using the CKD EPI equation. This calculation has not been validated in all clinical situations. eGFR's persistently <60 mL/min signify possible Chronic Kidney Disease.    Anion gap 6 5 - 15  Ethanol (ETOH)     Status: None   Collection Time: 06/27/15  2:18 PM  Result Value Ref Range   Alcohol, Ethyl (B) <5 <5 mg/dL    Comment:        LOWEST DETECTABLE LIMIT FOR SERUM ALCOHOL IS 5 mg/dL FOR MEDICAL PURPOSES ONLY   Salicylate level     Status: None   Collection Time: 06/27/15  2:18 PM  Result Value Ref Range   Salicylate Lvl <1.6 2.8 - 30.0 mg/dL  Acetaminophen level     Status: Abnormal   Collection Time: 06/27/15  2:18 PM  Result Value Ref Range   Acetaminophen (Tylenol), Serum <10 (L) 10 - 30 ug/mL    Comment:        THERAPEUTIC CONCENTRATIONS VARY SIGNIFICANTLY. A RANGE OF 10-30 ug/mL MAY BE AN EFFECTIVE CONCENTRATION FOR MANY PATIENTS. HOWEVER, SOME ARE BEST TREATED AT CONCENTRATIONS OUTSIDE THIS RANGE. ACETAMINOPHEN CONCENTRATIONS >150 ug/mL AT 4 HOURS AFTER INGESTION AND >50 ug/mL AT 12 HOURS AFTER INGESTION ARE OFTEN ASSOCIATED WITH TOXIC REACTIONS.   CBC     Status: None   Collection Time: 06/27/15  2:18 PM  Result Value Ref Range   WBC 5.5 4.0 - 10.5 K/uL   RBC 4.81 3.87 - 5.11 MIL/uL   Hemoglobin 14.0 12.0 - 15.0 g/dL   HCT 41.7 36.0 - 46.0 %   MCV 86.7 78.0 - 100.0 fL   MCH 29.1 26.0 - 34.0 pg   MCHC 33.6 30.0 - 36.0 g/dL   RDW 13.2 11.5 - 15.5 %   Platelets 222 150 - 400 K/uL  I-Stat beta hCG blood, ED ([facility], [facility], [facility] only)     Status: None   Collection Time: 06/27/15  2:34 PM  Result Value Ref Range   I-stat hCG, quantitative <5.0 <5 mIU/mL   Comment 3            Comment:   GEST. AGE       CONC.  (mIU/mL)   <=1 WEEK        5 - 50     2 WEEKS       50 - 500     3 WEEKS       100 - 10,000     4 WEEKS     1,000 - 30,000        FEMALE AND NON-PREGNANT FEMALE:     LESS THAN 5 mIU/mL   Urine rapid drug screen (hosp performed) (Not at [facility])     Status: Abnormal   Collection Time: 06/27/15  3:50 PM  Result Value Ref Range   Opiates NONE DETECTED NONE DETECTED   Cocaine NONE DETECTED NONE DETECTED   Benzodiazepines POSITIVE (A) NONE DETECTED   Amphetamines NONE DETECTED NONE DETECTED   Tetrahydrocannabinol NONE DETECTED NONE DETECTED   Barbiturates NONE DETECTED NONE DETECTED    Comment:        DRUG SCREEN FOR MEDICAL PURPOSES ONLY.  IF CONFIRMATION IS NEEDED FOR ANY PURPOSE, NOTIFY LAB WITHIN 5 DAYS.        LOWEST DETECTABLE LIMITS FOR URINE DRUG SCREEN Drug Class  Cutoff (ng/mL) Amphetamine      1000 Barbiturate      200 Benzodiazepine   500 Tricyclics       164 Opiates          300 Cocaine          300 THC              50     Observation Level/Precautions:  15 minute checks  Laboratory:  As per the ED  Psychotherapy:  Individual/group  Medications:  Continue the Luvox/Buspar/Xanax   Consultations:    Discharge Concerns:    Estimated LOS: will be D/C to the IOP  Other:     Psychological Evaluations: No   Treatment Plan Summary: Daily contact with patient to assess and evaluate symptoms and progress in treatment and Medication management Supportive approach/coping skills Depression; will continue the Luvox 150 mg BID and recommend Abilify augmentation Anxiety; pursue the Xanax back at the 0.5 mg BID               Also pursue the Buspar 30 mg BID Work with CBT/mindfulness [place] states she will be safe at home. Does not want to stay in the inpatient unit. States being here is creating more anxiety for her. She is willing to come to the [facility] starting Wednesday and pursue more intensive work. She was reassured by her principal's support, giving her all  the time that she would need to get better. 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   Jennings Corado A 8/29/20162:30 PM

## 2015-06-28 NOTE — ED Notes (Signed)
Breakfast delivered, pt eating w/ sitter at bedside.  Pt reports she got "some rest" last night.

## 2015-06-28 NOTE — Progress Notes (Signed)
Pt d/c from the hospital with her husband. All items returned. D/C instructions given and prescription given. Pt contracts for safety.

## 2015-06-28 NOTE — Progress Notes (Signed)
Pt admitted voluntary with si thoughts to OD. Pt reports increased anxiety with school starting today and feeling overwhelmed. Pt is a first grade school teacher. Pt tried to have her xanax decreased recently that she has been taking for years and she started having more anxiety. Pt's doctor increased the dose to what she was taking. She continues to have increased anxiety. Pt reports that she has a hx of OCD behaviors and is a "perfectionist." Pt goes to [facility] for support. Pt has a supportive husband and a 40 yr old that is married and an 70 year old child. Pt has a support system of friends, family and church. This is pt's first hospitalization. She denies alcohol use. No family hx of abuse or suicide.

## 2015-06-28 NOTE — BHH Suicide Risk Assessment (Signed)
[facility] Discharge Suicide Risk Assessment   Demographic Factors:  Caucasian  Total Time spent with patient: 45 minutes  Musculoskeletal: Strength & Muscle Tone: within normal limits Gait & Station: normal Patient leans: normal  Psychiatric Specialty Exam: Physical Exam  ROS  Blood pressure 125/93, pulse 81, temperature 98.1 F (36.7 C), temperature source Oral, resp. rate 16, height 5\' 2"  (1.575 m), weight 56.881 kg (125 lb 6.4 oz), last menstrual period 01/29/2015.Body mass index is 22.93 kg/(m^2).  General Appearance: Fairly Groomed  Patent attorney::  Fair  Speech:  Clear and Coherent409  Volume:  Normal  Mood:  Euthymic  Affect:  Appropriate  Thought Process:  Coherent and Goal Directed  Orientation:  Full (Time, Place, and Person)  Thought Content:  plans as she moves on  Suicidal Thoughts:  No  Homicidal Thoughts:  No  Memory:  Immediate;   Fair Recent;   Fair Remote;   Fair  Judgement:  Fair  Insight:  Present  Psychomotor Activity:  Normal  Concentration:  Fair  Recall:  [employer] of Knowledge:Fair  Language: Fair  Akathisia:  No  Handed:  Right  AIMS (if indicated):     Assets:  Desire for Improvement Housing Physical Health Social Support Talents/Skills Vocational/Educational  Sleep:     Cognition: WNL  ADL's:  Intact   Have you used any form of tobacco in the last 30 days? (Cigarettes, Smokeless Tobacco, Cigars, and/or Pipes): No  Has this patient used any form of tobacco in the last 30 days? (Cigarettes, Smokeless Tobacco, Cigars, and/or Pipes) No  Mental Status Per Nursing Assessment::   On Admission:  Suicidal ideation indicated by patient, Suicide plan  Current Mental Status by Physician: In full contact with reality. There are no active SI plans or intent. She states she will not hurt herself. She is able to identify multiple deterrents to her attempting it. She is willing to pursue the [facility] starting Wednesday. The Principal has communicated  trough her husband that she can take whatever amount of time she needs to get herself well before returning to school. This has taken a lot of pressure from her.     Loss Factors: NA  Historical Factors: NA  Risk Reduction Factors:   Sense of responsibility to family, Religious beliefs about death, Employed, Living with another person, especially a relative and Positive social support  Continued Clinical Symptoms:  Depression:   Insomnia Obsessive-Compulsive Disorder  Cognitive Features That Contribute To Risk:  None    Suicide Risk:  Minimal: No identifiable suicidal ideation.  Patients presenting with no risk factors but with morbid ruminations; may be classified as minimal risk based on the severity of the depressive symptoms  Principal Problem: MDD (major depressive disorder) Discharge Diagnoses:  Patient Active Problem List   Diagnosis Date Noted  . MDD (major depressive disorder) [F32.2] 06/28/2015  . OCD (obsessive compulsive disorder) [F42] 06/28/2015    Follow-up Information    Follow up with [facility].   Why:  Medication management appointment with Anne Fu on Monday Sept. 19th at 3:40pm & therapy appointment with Ulice Bold on Friday Sept. 23 at 4:30pm. Please inquire about Dr. Alwyn Ren availability at next appointment & call if you need to reschedule.   Contact information:   [address] [place], [place] [address] Phone: [phone]       Follow up with [facility] Out[facility] On 06/30/2015.   Why:  Appointment for Intensive Outpatient Program (IOP) with Jeri Modena on  Wednesday Augut 31st at 8:45am. Please bring insurance card and call office if you need to reschedule.   Contact information:   [address] Ginette Otto [place] [phone]      Plan Of Care/Follow-up recommendations:  Activity:  as tolerated Diet:  regular Follow up [facility] as above Is patient on multiple antipsychotic therapies at discharge:  No   Has  Patient had three or more failed trials of antipsychotic monotherapy by history:  No  Recommended Plan for Multiple Antipsychotic Therapies: NA    Ambur Province A 06/28/2015, 4:29 PM

## 2015-06-28 NOTE — Progress Notes (Signed)
Per Minerva Areola, [facility], pt accepted to [facility] bed 403-1 by Dr. Jama Flavors. Admission is voluntary.  Spoke with [facility] re: pt's placement.   Ilean Skill, MSW, LCSW Clinical Social Work, Disposition  06/28/2015 [phone]

## 2015-06-28 NOTE — BHH Counselor (Signed)
No PSA completed as patient discharged prior to 72 hours.  Jenella Craigie, MSW, LCSWA Clinical Social Worker [facility] [phone]  

## 2015-06-28 NOTE — ED Notes (Signed)
Pt ambulatory with steady gait departing from ER with [facility] transporter Lurena Joiner. NAD. A/O x4.

## 2015-06-28 NOTE — Progress Notes (Signed)
  [facility] Adult Case Management Discharge Plan :  Will you be returning to the same living situation after discharge:  Yes,  patient plans to return home At discharge, do you have transportation home?: Yes,  patient's husband will provide transport Do you have the ability to pay for your medications: Yes,  patient will be provided with prescriptions at discharge  Release of information consent forms completed and in the chart;  Patient's signature needed at discharge.  Patient to Follow up at: Follow-up Information    Follow up with [facility].   Why:  Medication management appointment with Anne Fu on Monday Sept. 19th at 3:40pm & therapy appointment with Ulice Bold on Friday Sept. 23 at 4:30pm. Please inquire about Dr. Alwyn Ren availability at next appointment & call if you need to reschedule.   Contact information:   [address] [place], [place] [address] Phone: [phone]       Follow up with [facility] Outpatient On 06/30/2015.   Why:  Appointment for Intensive Outpatient Program (IOP) with Jeri Modena on Wednesday Augut 31st at 8:45am. Please bring insurance card and call office if you need to reschedule.   Contact information:   [address] Ginette Otto [place] [phone]      Patient denies SI/HI: Yes,  denies    Safety Planning and Suicide Prevention discussed: Yes,  with patient and husband  Have you used any form of tobacco in the last 30 days? (Cigarettes, Smokeless Tobacco, Cigars, and/or Pipes): No  Has patient been referred to the Quitline?: N/A patient is not a smoker  Davinia Riccardi, [place] 06/28/2015, 3:51 PM

## 2015-06-28 NOTE — ED Provider Notes (Signed)
Accepted in transfer to [facility] by Dr. Adela Glimpse. D/W patient. Examined patient and is stable for transfer.   Filed Vitals:   06/28/15 0620  BP: 129/77  Pulse: 57  Temp: 98.3 F (36.8 C)  Resp: 16     Marily Memos, MD 06/28/15 1710

## 2015-06-28 NOTE — [facility] Suicide Risk Assessment (Signed)
[facility] INPATIENT:  Family/Significant Other Suicide Prevention Education  Suicide Prevention Education:  Education Completed; husband Rozanne Heumann [phone],  (name of family member/significant other) has been identified by the patient as the family member/significant other with whom the patient will be residing, and identified as the person(s) who will aid the patient in the event of a mental health crisis (suicidal ideations/suicide attempt).  With written consent from the patient, the family member/significant other has been provided the following suicide prevention education, prior to the and/or following the discharge of the patient.  The suicide prevention education provided includes the following:  Suicide risk factors  Suicide prevention and interventions  National Suicide Hotline telephone number  [facility] assessment telephone number  [facility] Emergency Assistance 911  [facility] and/or Residential Mobile Crisis Unit telephone number  Request made of family/significant other to:  Remove weapons (e.g., guns, rifles, knives), all items previously/currently identified as safety concern.    Remove drugs/medications (over-the-counter, prescriptions, illicit drugs), all items previously/currently identified as a safety concern.  The family member/significant other verbalizes understanding of the suicide prevention education information provided.  The family member/significant other agrees to remove the items of safety concern listed above.  Denaly Gatling, [place] 06/28/2015, 3:42 PM

## 2015-06-29 NOTE — Discharge Summary (Signed)
Physician Discharge Summary Note  Patient:  Natasha Kennedy is an 54 y.o., female MRN:  [id number] DOB:  1961-08-25 Patient phone:  [phone] (home)  Patient address:   [address] [place] [address],  Total Time spent with patient: Greater than 30 minutes  Date of Admission:  06/28/2015  Date of Discharge: 08-30 -16  Reason for Admission:  Worsening symptoms of depression  Principal Problem: MDD (major depressive disorder)  Discharge Diagnoses: Patient Active Problem List   Diagnosis Date Noted  . MDD (major depressive disorder) [F32.2] 06/28/2015  . OCD (obsessive compulsive disorder) [F42] 06/28/2015  . Major depressive disorder, recurrent, severe without psychotic features [F33.2]    Musculoskeletal: Strength & Muscle Tone: within normal limits Gait & Station: normal Patient leans: N/A  Psychiatric Specialty Exam: Physical Exam  Psychiatric: Her speech is normal and behavior is normal. Judgment and thought content normal. Her mood appears not anxious. Her affect is not angry, not blunt, not labile and not inappropriate. Cognition and memory are normal. She does not exhibit a depressed mood.    Review of Systems  Constitutional: Negative.   HENT: Negative.   Eyes: Negative.   Respiratory: Negative.   Cardiovascular: Negative.   Gastrointestinal: Negative.   Genitourinary: Negative.   Musculoskeletal: Negative.   Skin: Negative.   Endo/Heme/Allergies: Negative.   Psychiatric/Behavioral: Positive for depression. Negative for suicidal ideas, hallucinations, memory loss and substance abuse. The patient is nervous/anxious (OCD) and has insomnia (Stable).     Blood pressure 125/93, pulse 81, temperature 98.1 F (36.7 C), temperature source Oral, resp. rate 16, height '5\' 2"'  (1.575 m), weight 56.881 kg (125 lb 6.4 oz), last menstrual period 01/29/2015.Body mass index is 22.93 kg/(m^2).  See Md's SRA  Have you used any form of tobacco in the last 30 days?  (Cigarettes, Smokeless Tobacco, Cigars, and/or Pipes): No  Has this patient used any form of tobacco in the last 30 days? (Cigarettes, Smokeless Tobacco, Cigars, and/or Pipes) No  Past Medical History:  Past Medical History  Diagnosis Date  . OCD (obsessive compulsive disorder)   . Depression   . Anxiety   . Plantar fasciitis of left foot     Past Surgical History  Procedure Laterality Date  . Cholecystectomy    . Abdominal surgery    . Rhinoplasty     Family History: History reviewed. No pertinent family history.  Social History:  History  Alcohol Use No    Comment: Patient denies      History  Drug Use No    Comment: Patient denies     Social History   Social History  . Marital Status: Married    Spouse Name: N/A  . Number of Children: N/A  . Years of Education: N/A   Social History Main Topics  . Smoking status: Never Smoker   . Smokeless tobacco: Never Used  . Alcohol Use: No     Comment: Patient denies   . Drug Use: No     Comment: Patient denies   . Sexual Activity: Yes    Birth Control/ Protection: Condom   Other Topics Concern  . None   Social History Narrative   Risk to Self: Is patient at risk for suicide?: Yes Risk to Others: No Prior Inpatient Therapy: No Prior Outpatient Therapy: Yes  Level of Care:  OP  Hospital Course:  Natasha Kennedy states she has been having some suicidal thoughts over the last 2 weeks. States she came to the ED to be evaluated. She  states she started having OCD symptoms when she was pregnant with her son who is now 66. States her OCD is well controlled with the Luvox. 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 States it was stressful as she was by herself without a team of teachers. 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 States she was on Xanax 0.5 mg BID for 10 years. States she was wanting to come off the Xanax. She worked  on decreasing it to 0.25 BID. The anxiety got worst when she went down to this dose. She went back to the full dose 2 weeks ago. States she has continued to have a lot of anxiety as she got ready to start school and went to the meetings and she started listening to the expectations requirements etc got more upset. States she was supporsed to start school today. Yesterday she felt she overwhelmed. For the last 2 weeks she has had thoughts of suicide. She goes over different scenarios. But states that thinking about her family would keep her from acting out. She currently denies any suicidal thoughts.   After admission assessment, Natasha Kennedy was resumed on all her pertinent previous psychiatric medications. Abilify was added as an adjunct treatment to Luvox to help with her depression & OCD. She has asked to be discharged to continue psychiatric treatment on outpatient basis. Upon discharge, Natasha Kennedy is in full contact with reality. There are no active SI plans or intent. She states she will not hurt herself. She is able to identify multiple deterrents to her attempting it. She is willing to pursue the [facility] starting Wednesday. The Principal has communicated through her husband that she can take whatever amount of time she needs to get herself well before returning to school. This has taken a lot of pressure off her.Natasha Kennedy will follow-up care as noted below. Transportation per husband.  Consults:  psychiatry  Significant Diagnostic Studies:  labs: CBC with diff, CMP, UDS, Toxicology tests, U/A, results reviewed, stable  Discharge Vitals:   Blood pressure 125/93, pulse 81, temperature 98.1 F (36.7 C), temperature source Oral, resp. rate 16, height '5\' 2"'  (1.575 m), weight 56.881 kg (125 lb 6.4 oz), last menstrual period 01/29/2015. Body mass index is 22.93 kg/(m^2). Lab Results:   Results for orders placed or performed during the hospital encounter of 06/27/15 (from the past 72 hour(s))  Comprehensive  metabolic panel     Status: Abnormal   Collection Time: 06/27/15  2:18 PM  Result Value Ref Range   Sodium 142 135 - 145 mmol/L   Potassium 4.2 3.5 - 5.1 mmol/L   Chloride 105 101 - 111 mmol/L   CO2 31 22 - 32 mmol/L   Glucose, Bld 91 65 - 99 mg/dL   BUN 16 6 - 20 mg/dL   Creatinine, Ser 0.98 0.44 - 1.00 mg/dL   Calcium 10.3 8.9 - 10.3 mg/dL   Total Protein 7.5 6.5 - 8.1 g/dL   Albumin 4.0 3.5 - 5.0 g/dL   AST 20 15 - 41 U/L   ALT 15 14 - 54 U/L   Alkaline Phosphatase 47 38 - 126 U/L   Total Bilirubin 0.2 (L) 0.3 - 1.2 mg/dL   GFR calc non Af Amer >60 >60 mL/min   GFR calc Af Amer >60 >60 mL/min    Comment: (NOTE) The eGFR has been calculated using the CKD EPI equation. This calculation has  not been validated in all clinical situations. eGFR's persistently <60 mL/min signify possible Chronic Kidney Disease.    Anion gap 6 5 - 15  Ethanol (ETOH)     Status: None   Collection Time: 06/27/15  2:18 PM  Result Value Ref Range   Alcohol, Ethyl (B) <5 <5 mg/dL    Comment:        LOWEST DETECTABLE LIMIT FOR SERUM ALCOHOL IS 5 mg/dL FOR MEDICAL PURPOSES ONLY   Salicylate level     Status: None   Collection Time: 06/27/15  2:18 PM  Result Value Ref Range   Salicylate Lvl <8.7 2.8 - 30.0 mg/dL  Acetaminophen level     Status: Abnormal   Collection Time: 06/27/15  2:18 PM  Result Value Ref Range   Acetaminophen (Tylenol), Serum <10 (L) 10 - 30 ug/mL    Comment:        THERAPEUTIC CONCENTRATIONS VARY SIGNIFICANTLY. A RANGE OF 10-30 ug/mL MAY BE AN EFFECTIVE CONCENTRATION FOR MANY PATIENTS. HOWEVER, SOME ARE BEST TREATED AT CONCENTRATIONS OUTSIDE THIS RANGE. ACETAMINOPHEN CONCENTRATIONS >150 ug/mL AT 4 HOURS AFTER INGESTION AND >50 ug/mL AT 12 HOURS AFTER INGESTION ARE OFTEN ASSOCIATED WITH TOXIC REACTIONS.   CBC     Status: None   Collection Time: 06/27/15  2:18 PM  Result Value Ref Range   WBC 5.5 4.0 - 10.5 K/uL   RBC 4.81 3.87 - 5.11 MIL/uL   Hemoglobin 14.0 12.0  - 15.0 g/dL   HCT 41.7 36.0 - 46.0 %   MCV 86.7 78.0 - 100.0 fL   MCH 29.1 26.0 - 34.0 pg   MCHC 33.6 30.0 - 36.0 g/dL   RDW 13.2 11.5 - 15.5 %   Platelets 222 150 - 400 K/uL  I-Stat beta hCG blood, ED ([facility], [facility], [facility] only)     Status: None   Collection Time: 06/27/15  2:34 PM  Result Value Ref Range   I-stat hCG, quantitative <5.0 <5 mIU/mL   Comment 3            Comment:   GEST. AGE      CONC.  (mIU/mL)   <=1 WEEK        5 - 50     2 WEEKS       50 - 500     3 WEEKS       100 - 10,000     4 WEEKS     1,000 - 30,000        FEMALE AND NON-PREGNANT FEMALE:     LESS THAN 5 mIU/mL   Urine rapid drug screen (hosp performed) (Not at [facility])     Status: Abnormal   Collection Time: 06/27/15  3:50 PM  Result Value Ref Range   Opiates NONE DETECTED NONE DETECTED   Cocaine NONE DETECTED NONE DETECTED   Benzodiazepines POSITIVE (A) NONE DETECTED   Amphetamines NONE DETECTED NONE DETECTED   Tetrahydrocannabinol NONE DETECTED NONE DETECTED   Barbiturates NONE DETECTED NONE DETECTED    Comment:        DRUG SCREEN FOR MEDICAL PURPOSES ONLY.  IF CONFIRMATION IS NEEDED FOR ANY PURPOSE, NOTIFY LAB WITHIN 5 DAYS.        LOWEST DETECTABLE LIMITS FOR URINE DRUG SCREEN Drug Class       Cutoff (ng/mL) Amphetamine      1000 Barbiturate      200 Benzodiazepine   681 Tricyclics       157 Opiates  300 Cocaine          300 THC              50     Physical Findings: AIMS: Facial and Oral Movements Muscles of Facial Expression: None, normal Lips and Perioral Area: None, normal Jaw: None, normal Tongue: None, normal,Extremity Movements Upper (arms, wrists, hands, fingers): None, normal Lower (legs, knees, ankles, toes): None, normal, Trunk Movements Neck, shoulders, hips: None, normal, Overall Severity Severity of abnormal movements (highest score from questions above): None, normal Incapacitation due to abnormal movements: None, normal Patient's awareness of abnormal movements (rate  only patient's report): No Awareness, Dental Status Current problems with teeth and/or dentures?: No Does patient usually wear dentures?: No  CIWA:    COWS:      See Psychiatric Specialty Exam and Suicide Risk Assessment completed by Attending Physician prior to discharge.  Discharge destination:  Home  Is patient on multiple antipsychotic therapies at discharge:  No   Has Patient had three or more failed trials of antipsychotic monotherapy by history:  No    Recommended Plan for Multiple Antipsychotic Therapies: NA    Medication List    STOP taking these medications        ibuprofen 200 MG tablet  Commonly known as:  ADVIL,MOTRIN     Melatonin 5 MG Tabs     meloxicam 15 MG tablet  Commonly known as:  MOBIC      TAKE these medications      Indication   ALPRAZolam 0.5 MG tablet  Commonly known as:  XANAX  Take 1 tablet (0.5 mg total) by mouth 2 (two) times daily. For anxiety   Indication:  Feeling Anxious     ARIPiprazole 2 MG tablet  Commonly known as:  ABILIFY  Take 1 tablet (2 mg total) by mouth daily. Mood control/adjunct to Luvox for depression   Indication:  Mood control/adjunct to Luvox for depression     busPIRone 30 MG tablet  Commonly known as:  BUSPAR  Take 1 tablet (30 mg total) by mouth 2 (two) times daily. For anxiety   Indication:  Generalized Anxiety Disorder     Fluvoxamine Maleate 150 MG Cp24  Take 1 capsule (150 mg total) by mouth 2 (two) times daily. For depression   Indication:  Depression     hydrOXYzine 25 MG tablet  Commonly known as:  ATARAX/VISTARIL  Take 1 tablet (25 mg total) by mouth every 4 (four) hours as needed for anxiety (Sleep).   Indication:  Anxiety     traZODone 50 MG tablet  Commonly known as:  DESYREL  Take 1 tablet (50 mg total) by mouth at bedtime. For insomnia   Indication:  Trouble Sleeping           Follow-up Information    Follow up with [facility].   Why:  Medication management appointment  with Comer Locket on Monday Sept. 19th at 3:40pm & therapy appointment with Rosary Lively on Friday Sept. 23 at 4:30pm. Please inquire about Dr. Casimiro Needle availability at next appointment & call if you need to reschedule.   Contact information:   [address] [place], [place] [address] Phone: [phone]       Follow up with [facility] On 06/30/2015.   Why:  Appointment for Intensive Outpatient Program (IOP) with Dellia Nims on Wednesday Augut 31st at 8:45am. Please bring insurance card and call office if you need to reschedule.   Contact information:  [place] [phone]     Follow-up recommendations: Activity:  As tolerated Diet: As recommended by your primary care doctor. Keep all scheduled follow-up appointments as recommended.    Comments: Take all your medications as prescribed by your mental healthcare provider. Report any adverse effects and or reactions from your medicines to your outpatient provider promptly. Patient is instructed and cautioned to not engage in alcohol and or illegal drug use while on prescription medicines. In the event of worsening symptoms, patient is instructed to call the crisis hotline, 911 and or go to the nearest ED for appropriate evaluation and treatment of symptoms. Follow-up with your primary care provider for your other medical issues, concerns and or health care needs.   Total Discharge Time: Greater than 30 minutes  Signed: Encarnacion Slates, PMHNP, FNP-BC 06/29/2015, 4:49 PM  I personally assessed the patient and formulated the plan Geralyn Flash A. Sabra Heck, M.D.

## 2015-06-30 ENCOUNTER — Encounter (HOSPITAL_COMMUNITY): Payer: Self-pay | Admitting: Psychiatry

## 2015-06-30 ENCOUNTER — Other Ambulatory Visit (HOSPITAL_COMMUNITY): Payer: BC Managed Care – PPO | Admitting: Psychiatry

## 2015-07-01 ENCOUNTER — Other Ambulatory Visit (HOSPITAL_COMMUNITY): Payer: BC Managed Care – PPO

## 2015-07-02 ENCOUNTER — Other Ambulatory Visit (HOSPITAL_COMMUNITY): Payer: BC Managed Care – PPO

## 2015-07-06 ENCOUNTER — Other Ambulatory Visit (HOSPITAL_COMMUNITY): Payer: BC Managed Care – PPO

## 2015-07-07 ENCOUNTER — Other Ambulatory Visit (HOSPITAL_COMMUNITY): Payer: BC Managed Care – PPO

## 2015-07-08 ENCOUNTER — Other Ambulatory Visit (HOSPITAL_COMMUNITY): Payer: BC Managed Care – PPO

## 2015-07-09 ENCOUNTER — Other Ambulatory Visit (HOSPITAL_COMMUNITY): Payer: BC Managed Care – PPO

## 2015-07-12 ENCOUNTER — Other Ambulatory Visit (HOSPITAL_COMMUNITY): Payer: BC Managed Care – PPO

## 2015-07-13 ENCOUNTER — Other Ambulatory Visit (HOSPITAL_COMMUNITY): Payer: BC Managed Care – PPO

## 2015-07-14 ENCOUNTER — Other Ambulatory Visit (HOSPITAL_COMMUNITY): Payer: BC Managed Care – PPO

## 2015-07-15 ENCOUNTER — Other Ambulatory Visit (HOSPITAL_COMMUNITY): Payer: BC Managed Care – PPO

## 2015-07-16 ENCOUNTER — Other Ambulatory Visit (HOSPITAL_COMMUNITY): Payer: BC Managed Care – PPO

## 2015-07-19 ENCOUNTER — Ambulatory Visit (INDEPENDENT_AMBULATORY_CARE_PROVIDER_SITE_OTHER): Payer: BC Managed Care – PPO

## 2015-07-19 ENCOUNTER — Encounter: Payer: Self-pay | Admitting: Podiatry

## 2015-07-19 ENCOUNTER — Other Ambulatory Visit (HOSPITAL_COMMUNITY): Payer: BC Managed Care – PPO

## 2015-07-19 ENCOUNTER — Ambulatory Visit (INDEPENDENT_AMBULATORY_CARE_PROVIDER_SITE_OTHER): Payer: BC Managed Care – PPO | Admitting: Podiatry

## 2015-07-19 VITALS — BP 126/76 | HR 62 | Resp 12

## 2015-07-19 DIAGNOSIS — M779 Enthesopathy, unspecified: Secondary | ICD-10-CM | POA: Diagnosis not present

## 2015-07-19 DIAGNOSIS — G5792 Unspecified mononeuropathy of left lower limb: Secondary | ICD-10-CM

## 2015-07-19 NOTE — Progress Notes (Signed)
   Subjective:    Patient ID: Natasha Kennedy, female    DOB: April 05, 1961, 54 y.o.   MRN: [id number]  HPI: She presents today with a chief complaint of pain to the dorsal aspect of left foot states that as then there for the past few weeks and seems to be worse with running and walking. She states that when she is not exercising her foot does not hurt. She states that some of the pain radiates up the anterior part of the leg.    Review of Systems  All other systems reviewed and are negative.      Objective:   Physical Exam: I have reviewed her past medical history medications and allergies. 54 year old female no acute distress pulses are strongly palpable neurologic system is intact percent fussy monofilament. Deep tendon reflexes are intact. Muscle strength +5 over 5 dorsiflexion plantar flexors and inverters and everters all of his musculature is intact. Orthopedic evaluation demonstrates all joints distal to the ankle for range of motion without crepitation. There is pain on palpation of the deep peroneal nerve with underlying osseous abnormalities. Radiographs do not demonstrate any Osseous abnormality in that area today. Cutaneous evaluation Mr. supple well-hydrated cutis no erythema edema cellulitis drainage or odor.        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 Neuritis deep peroneal nerve left foot.  Plan: Discussed etiology pathology conservative versus surgical therapies. At this point we discussed appropriate shoe gear lacing techniques etc. I offered her an injection which she declined. I will follow-up with her in 1 month.

## 2015-07-20 ENCOUNTER — Other Ambulatory Visit (HOSPITAL_COMMUNITY): Payer: BC Managed Care – PPO

## 2015-07-21 ENCOUNTER — Other Ambulatory Visit (HOSPITAL_COMMUNITY): Payer: BC Managed Care – PPO

## 2015-07-22 ENCOUNTER — Other Ambulatory Visit (HOSPITAL_COMMUNITY): Payer: BC Managed Care – PPO

## 2015-07-23 ENCOUNTER — Other Ambulatory Visit (HOSPITAL_COMMUNITY): Payer: BC Managed Care – PPO

## 2015-07-26 ENCOUNTER — Other Ambulatory Visit (HOSPITAL_COMMUNITY): Payer: BC Managed Care – PPO

## 2015-07-27 ENCOUNTER — Other Ambulatory Visit (HOSPITAL_COMMUNITY): Payer: BC Managed Care – PPO

## 2015-07-28 ENCOUNTER — Ambulatory Visit: Payer: BC Managed Care – PPO | Admitting: Podiatry

## 2015-07-28 ENCOUNTER — Other Ambulatory Visit (HOSPITAL_COMMUNITY): Payer: BC Managed Care – PPO

## 2015-07-29 ENCOUNTER — Other Ambulatory Visit (HOSPITAL_COMMUNITY): Payer: BC Managed Care – PPO

## 2015-07-30 ENCOUNTER — Other Ambulatory Visit (HOSPITAL_COMMUNITY): Payer: BC Managed Care – PPO

## 2015-08-02 ENCOUNTER — Other Ambulatory Visit (HOSPITAL_COMMUNITY): Payer: BC Managed Care – PPO

## 2015-08-03 ENCOUNTER — Other Ambulatory Visit (HOSPITAL_COMMUNITY): Payer: BC Managed Care – PPO

## 2015-08-04 ENCOUNTER — Other Ambulatory Visit (HOSPITAL_COMMUNITY): Payer: BC Managed Care – PPO

## 2015-08-05 ENCOUNTER — Other Ambulatory Visit (HOSPITAL_COMMUNITY): Payer: BC Managed Care – PPO

## 2015-08-06 ENCOUNTER — Other Ambulatory Visit (HOSPITAL_COMMUNITY): Payer: BC Managed Care – PPO

## 2015-08-09 ENCOUNTER — Other Ambulatory Visit (HOSPITAL_COMMUNITY): Payer: BC Managed Care – PPO

## 2015-08-10 ENCOUNTER — Other Ambulatory Visit (HOSPITAL_COMMUNITY): Payer: BC Managed Care – PPO

## 2015-08-11 ENCOUNTER — Other Ambulatory Visit (HOSPITAL_COMMUNITY): Payer: BC Managed Care – PPO

## 2015-08-12 ENCOUNTER — Other Ambulatory Visit (HOSPITAL_COMMUNITY): Payer: BC Managed Care – PPO

## 2015-08-13 ENCOUNTER — Other Ambulatory Visit (HOSPITAL_COMMUNITY): Payer: BC Managed Care – PPO

## 2015-08-16 ENCOUNTER — Other Ambulatory Visit (HOSPITAL_COMMUNITY): Payer: BC Managed Care – PPO

## 2015-08-17 ENCOUNTER — Other Ambulatory Visit (HOSPITAL_COMMUNITY): Payer: BC Managed Care – PPO

## 2015-08-18 ENCOUNTER — Other Ambulatory Visit (HOSPITAL_COMMUNITY): Payer: BC Managed Care – PPO

## 2015-11-26 ENCOUNTER — Encounter: Payer: Self-pay | Admitting: Family Medicine

## 2015-11-26 ENCOUNTER — Ambulatory Visit (INDEPENDENT_AMBULATORY_CARE_PROVIDER_SITE_OTHER): Payer: BLUE CROSS/BLUE SHIELD | Admitting: Family Medicine

## 2015-11-26 VITALS — BP 123/83 | HR 62 | Temp 98.3°F | Ht 62.3 in | Wt 123.0 lb

## 2015-11-26 DIAGNOSIS — H6091 Unspecified otitis externa, right ear: Secondary | ICD-10-CM | POA: Diagnosis not present

## 2015-11-26 MED ORDER — NEOMYCIN-POLYMYXIN-HC 3.5-10000-1 OT SOLN: 4.0000 [drp] | Freq: Four times a day (QID) | OTIC | Status: DC

## 2015-11-26 NOTE — Patient Instructions (Signed)

## 2015-11-26 NOTE — Progress Notes (Signed)
BP 123/83 mmHg  Pulse 62  Temp(Src) 98.3 F (36.8 C)  Ht 5' 2.3" (1.582 m)  Wt 123 lb (55.792 kg)  BMI 22.29 kg/m2  SpO2 99%  LMP 01/29/2015 (Approximate)   Subjective:    Patient ID: Natasha Kennedy, female    DOB: December 29, 1960, 55 y.o.   MRN: [id number]  HPI: Natasha Kennedy is a 55 y.o. female  Chief Complaint  Patient presents with  . Ear Drainage    right ear x10 months, no pain in the beginning now she is starting to have pain.   EAR PAIN Duration: 10 months, started hurting occasionally for the last month Involved ear(s): right Severity:  1/10  Quality:  dull Fever: no Otorrhea: yes, yellowish fluid Upper respiratory infection symptoms: no Pruritus: yes Hearing loss: no Water immersion no Using Q-tips: yes Recurrent otitis media: no Status: worse Treatments attempted: none  Did go to the walk in in the spring and had an ear infection.    Relevant past medical, surgical, family and social history reviewed and updated as indicated. Interim medical history since our last visit reviewed. Allergies and medications reviewed and updated.  Review of Systems  Constitutional: Negative.   HENT: Positive for ear discharge and ear pain. Negative for congestion, dental problem, drooling, facial swelling, hearing loss, mouth sores, nosebleeds, postnasal drip, rhinorrhea, sinus pressure, sneezing, sore throat, tinnitus, trouble swallowing and voice change.   Respiratory: Negative.   Cardiovascular: Negative.     Per HPI unless specifically indicated above     Objective:    BP 123/83 mmHg  Pulse 62  Temp(Src) 98.3 F (36.8 C)  Ht 5' 2.3" (1.582 m)  Wt 123 lb (55.792 kg)  BMI 22.29 kg/m2  SpO2 99%  LMP 01/29/2015 (Approximate)  Wt Readings from Last 3 Encounters:  11/26/15 123 lb (55.792 kg)  04/16/14 142 lb (64.411 kg)  06/28/15 125 lb 6.4 oz (56.881 kg)    Physical Exam  Constitutional: She is oriented to person, place, and time. She appears well-developed and  well-nourished. No distress.  HENT:  Head: Normocephalic and atraumatic.  Right Ear: Hearing normal.  Left Ear: Hearing and external ear normal.  Nose: Nose normal.  Mouth/Throat: Oropharynx is clear and moist. No oropharyngeal exudate.  Very dry EACs, area of erythema without pus at 3-6 o'clock of R EAC  Eyes: Conjunctivae, EOM and lids are normal. Pupils are equal, round, and reactive to light. Right eye exhibits no discharge. Left eye exhibits no discharge. No scleral icterus.  Neck: Normal range of motion. Neck supple. No JVD present. No tracheal deviation present. No thyromegaly present.  Pulmonary/Chest: Effort normal. No stridor. No respiratory distress.  Musculoskeletal: Normal range of motion.  Lymphadenopathy:    She has no cervical adenopathy.  Neurological: She is alert and oriented to person, place, and time.  Skin: Skin is warm, dry and intact. No rash noted. She is not diaphoretic. No erythema. No pallor.  Psychiatric: She has a normal mood and affect. Her speech is normal and behavior is normal. Judgment and thought content normal. Cognition and memory are normal.    Results for orders placed or performed during the hospital encounter of 06/27/15  Comprehensive metabolic panel  Result Value Ref Range   Sodium 142 135 - 145 mmol/L   Potassium 4.2 3.5 - 5.1 mmol/L   Chloride 105 101 - 111 mmol/L   CO2 31 22 - 32 mmol/L   Glucose, Bld 91 65 - 99 mg/dL   BUN 16 6 -  20 mg/dL   Creatinine, Ser 9.60 0.44 - 1.00 mg/dL   Calcium 45.4 8.9 - 09.8 mg/dL   Total Protein 7.5 6.5 - 8.1 g/dL   Albumin 4.0 3.5 - 5.0 g/dL   AST 20 15 - 41 U/L   ALT 15 14 - 54 U/L   Alkaline Phosphatase 47 38 - 126 U/L   Total Bilirubin 0.2 (L) 0.3 - 1.2 mg/dL   GFR calc non Af Amer >60 >60 mL/min   GFR calc Af Amer >60 >60 mL/min   Anion gap 6 5 - 15  Ethanol (ETOH)  Result Value Ref Range   Alcohol, Ethyl (B) <5 <5 mg/dL  Salicylate level  Result Value Ref Range   Salicylate Lvl <4.0 2.8 -  30.0 mg/dL  Acetaminophen level  Result Value Ref Range   Acetaminophen (Tylenol), Serum <10 (L) 10 - 30 ug/mL  CBC  Result Value Ref Range   WBC 5.5 4.0 - 10.5 K/uL   RBC 4.81 3.87 - 5.11 MIL/uL   Hemoglobin 14.0 12.0 - 15.0 g/dL   HCT 11.9 14.7 - 82.9 %   MCV 86.7 78.0 - 100.0 fL   MCH 29.1 26.0 - 34.0 pg   MCHC 33.6 30.0 - 36.0 g/dL   RDW 56.2 13.0 - 86.5 %   Platelets 222 150 - 400 K/uL  Urine rapid drug screen (hosp performed) (Not at [facility])  Result Value Ref Range   Opiates NONE DETECTED NONE DETECTED   Cocaine NONE DETECTED NONE DETECTED   Benzodiazepines POSITIVE (A) NONE DETECTED   Amphetamines NONE DETECTED NONE DETECTED   Tetrahydrocannabinol NONE DETECTED NONE DETECTED   Barbiturates NONE DETECTED NONE DETECTED  I-Stat beta hCG blood, ED ([facility], [facility], [facility] only)  Result Value Ref Range   I-stat hCG, quantitative <5.0 <5 mIU/mL   Comment 3              Assessment & Plan:   Problem List Items Addressed This Visit    None    Visit Diagnoses    Otitis externa, right    -  Primary    Will treat with cortisoporin. Also advised weekly mositurizing of her EACs with mineral oil or baby oil. Continue to monitor. Call with any concerns.        Follow up plan: Return if symptoms worsen or fail to improve.

## 2016-05-05 ENCOUNTER — Other Ambulatory Visit: Payer: Self-pay | Admitting: Certified Nurse Midwife

## 2016-05-05 DIAGNOSIS — Z1231 Encounter for screening mammogram for malignant neoplasm of breast: Secondary | ICD-10-CM

## 2016-06-01 ENCOUNTER — Ambulatory Visit: Payer: BC Managed Care – PPO

## 2016-06-26 ENCOUNTER — Ambulatory Visit (INDEPENDENT_AMBULATORY_CARE_PROVIDER_SITE_OTHER): Payer: Self-pay | Admitting: Family Medicine

## 2016-06-26 ENCOUNTER — Encounter: Payer: Self-pay | Admitting: Family Medicine

## 2016-06-26 VITALS — BP 108/74 | HR 71 | Temp 98.0°F | Ht 63.75 in | Wt 119.0 lb

## 2016-06-26 DIAGNOSIS — Z021 Encounter for pre-employment examination: Secondary | ICD-10-CM

## 2016-06-26 DIAGNOSIS — Z111 Encounter for screening for respiratory tuberculosis: Secondary | ICD-10-CM

## 2016-06-26 NOTE — Patient Instructions (Signed)
Follow up as needed

## 2016-06-26 NOTE — Progress Notes (Signed)
See scanned form

## 2016-06-28 ENCOUNTER — Other Ambulatory Visit: Payer: Self-pay | Admitting: Certified Nurse Midwife

## 2016-06-28 ENCOUNTER — Ambulatory Visit
Admission: RE | Admit: 2016-06-28 | Discharge: 2016-06-28 | Disposition: A | Discharge status: Home / Self Care | Payer: BC Managed Care – PPO | Source: Ambulatory Visit | Attending: Certified Nurse Midwife | Admitting: Certified Nurse Midwife

## 2016-06-28 DIAGNOSIS — Z1231 Encounter for screening mammogram for malignant neoplasm of breast: Secondary | ICD-10-CM

## 2016-06-29 ENCOUNTER — Encounter: Payer: Self-pay | Admitting: Family Medicine

## 2016-06-29 LAB — QUANTIFERON TB GOLD ASSAY (BLOOD)

## 2016-06-29 LAB — QUANTIFERON IN TUBE
QFT TB AG MINUS NIL VALUE: 0 IU/mL
QUANTIFERON MITOGEN VALUE: 7.21 IU/mL
QUANTIFERON TB AG VALUE: 0.02 IU/mL
QUANTIFERON TB GOLD: NEGATIVE
Quantiferon Nil Value: 0.02 IU/mL

## 2016-11-14 ENCOUNTER — Encounter: Payer: BC Managed Care – PPO | Admitting: Family Medicine

## 2017-01-18 ENCOUNTER — Encounter: Payer: Self-pay | Admitting: Family Medicine

## 2017-01-18 ENCOUNTER — Ambulatory Visit (INDEPENDENT_AMBULATORY_CARE_PROVIDER_SITE_OTHER): Payer: BC Managed Care – PPO | Admitting: Family Medicine

## 2017-01-18 VITALS — BP 111/76 | HR 110 | Temp 102.9°F | Wt 130.8 lb

## 2017-01-18 DIAGNOSIS — J111 Influenza due to unidentified influenza virus with other respiratory manifestations: Secondary | ICD-10-CM

## 2017-01-18 LAB — VERITOR FLU A/B WAIVED
INFLUENZA B: NEGATIVE
Influenza A: NEGATIVE

## 2017-01-18 MED ORDER — OSELTAMIVIR PHOSPHATE 75 MG PO CAPS: 75.0000 mg | ORAL_CAPSULE | Freq: Two times a day (BID) | ORAL | 0 refills | Status: DC

## 2017-01-18 NOTE — Patient Instructions (Signed)
Follow up as needed

## 2017-01-18 NOTE — Progress Notes (Signed)
BP 111/76 (BP Location: Right Arm, Patient Position: Sitting, Cuff Size: Small)   Pulse (!) 110   Temp (!) 102.9 F (39.4 C)   Wt 130 lb 12.8 oz (59.3 kg)   SpO2 97%   BMI 22.63 kg/m    Subjective:    Patient ID: Natasha Kennedy, female    DOB: 1961-06-20, 56 y.o.   MRN: [id number]  HPI: Natasha Kennedy is a 56 y.o. female  Chief Complaint  Patient presents with  . Fatigue  . Chills  . Generalized Body Aches  . Nasal Congestion   Sudden onset fever, chills, aches, congestion, sore throat, fatigue. Denies CP, SOB, cough, ear pain, N/V/D. Taking theraflu and advil with minimal relief. Works as a Engineer, site so lots of sick contacts.   Past Medical History:  Diagnosis Date  . Anxiety   . Depression   . OCD (obsessive compulsive disorder)   . Plantar fasciitis of left foot    Social History   Social History  . Marital status: Married    Spouse name: N/A  . Number of children: N/A  . Years of education: N/A   Occupational History  . Not on file.   Social History Main Topics  . Smoking status: Never Smoker  . Smokeless tobacco: Never Used  . Alcohol use No     Comment: very limited  . Drug use: No     Comment: Patient denies   . Sexual activity: Yes    Birth control/ protection: Condom   Other Topics Concern  . Not on file   Social History Narrative  . No narrative on file   Relevant past medical, surgical, family and social history reviewed and updated as indicated. Interim medical history since our last visit reviewed. Allergies and medications reviewed and updated.  Review of Systems  Constitutional: Positive for chills, diaphoresis, fatigue and fever.  HENT: Positive for congestion and sore throat.   Eyes: Negative.   Respiratory: Negative.   Cardiovascular: Negative.   Gastrointestinal: Negative.   Genitourinary: Negative.   Musculoskeletal: Positive for myalgias.  Psychiatric/Behavioral: Negative.     Per HPI unless specifically indicated  above     Objective:    BP 111/76 (BP Location: Right Arm, Patient Position: Sitting, Cuff Size: Small)   Pulse (!) 110   Temp (!) 102.9 F (39.4 C)   Wt 130 lb 12.8 oz (59.3 kg)   SpO2 97%   BMI 22.63 kg/m   Wt Readings from Last 3 Encounters:  01/18/17 130 lb 12.8 oz (59.3 kg)  06/26/16 119 lb (54 kg)  11/26/15 123 lb (55.8 kg)    Physical Exam  Constitutional: She is oriented to person, place, and time. She appears well-developed and well-nourished.  HENT:  Head: Atraumatic.  Eyes: Conjunctivae are normal. Pupils are equal, round, and reactive to light. No scleral icterus.  Neck: Normal range of motion. Neck supple.  Cardiovascular: Normal rate and normal heart sounds.   Pulmonary/Chest: Effort normal and breath sounds normal. She exhibits no tenderness.  Musculoskeletal: Normal range of motion.  Neurological: She is alert and oriented to person, place, and time.  Skin: Skin is warm and dry.  Psychiatric: She has a normal mood and affect. Her behavior is normal.  Nursing note and vitals reviewed.     Assessment & Plan:   Problem List Items Addressed This Visit    None    Visit Diagnoses    Influenza    -  Primary   Rapid  flu negative, but classic sxs. Tamiflu sent, supportive care discussed. Alternate tylenol and ibuprofen prn, push fluids. F/u if worsening   Relevant Medications   oseltamivir (TAMIFLU) 75 MG capsule   Other Relevant Orders   Veritor Flu A/B Waived (Completed)       Follow up plan: Return if symptoms worsen or fail to improve.

## 2017-01-29 ENCOUNTER — Telehealth: Payer: Self-pay | Admitting: Unknown Physician Specialty

## 2017-01-29 MED ORDER — AMOXICILLIN-POT CLAVULANATE 875-125 MG PO TABS: 1.0000 | ORAL_TABLET | Freq: Two times a day (BID) | ORAL | 0 refills | Status: DC

## 2017-01-29 NOTE — Telephone Encounter (Signed)
Called pt back, discussed worsening sxs the past 3-4 days after starting to feel better from flu. Congestion, sore throat, ear pain, HA, chills and night sweats. Discussed starting Augmentin in case secondary infection. Flonase BID, OTC throat relief medications. If no improvement over the next 3-4 days, come back by for recheck.

## 2017-01-29 NOTE — Telephone Encounter (Signed)
Pt was seen for the flu 01/18/17 and says she is still experiencing chills and sore throat.

## 2017-01-29 NOTE — Telephone Encounter (Signed)
Called and spoke to patient. She states that she is feeling a little better but still gets chills at night and still has a sore throat. Patient asked if these symptoms should linger this long with the flu. I told her that I was not sure but I would route the message to the provider to see what she says or suggests.

## 2017-01-29 NOTE — Telephone Encounter (Signed)
Patient would like for someone to call her back today as per her earlier message.  Thanks

## 2017-01-30 ENCOUNTER — Ambulatory Visit: Payer: BC Managed Care – PPO | Admitting: Family Medicine

## 2017-04-10 ENCOUNTER — Encounter: Payer: Self-pay | Admitting: Family Medicine

## 2017-04-10 ENCOUNTER — Ambulatory Visit (INDEPENDENT_AMBULATORY_CARE_PROVIDER_SITE_OTHER): Payer: BC Managed Care – PPO | Admitting: Family Medicine

## 2017-04-10 VITALS — BP 104/71 | HR 68 | Ht 63.39 in | Wt 130.0 lb

## 2017-04-10 DIAGNOSIS — Z23 Encounter for immunization: Secondary | ICD-10-CM

## 2017-04-10 DIAGNOSIS — Z1159 Encounter for screening for other viral diseases: Secondary | ICD-10-CM

## 2017-04-10 DIAGNOSIS — Z0000 Encounter for general adult medical examination without abnormal findings: Secondary | ICD-10-CM

## 2017-04-10 DIAGNOSIS — Z1329 Encounter for screening for other suspected endocrine disorder: Secondary | ICD-10-CM | POA: Diagnosis not present

## 2017-04-10 DIAGNOSIS — Z114 Encounter for screening for human immunodeficiency virus [HIV]: Secondary | ICD-10-CM | POA: Diagnosis not present

## 2017-04-10 DIAGNOSIS — Z131 Encounter for screening for diabetes mellitus: Secondary | ICD-10-CM | POA: Diagnosis not present

## 2017-04-10 DIAGNOSIS — Z13220 Encounter for screening for lipoid disorders: Secondary | ICD-10-CM | POA: Diagnosis not present

## 2017-04-10 LAB — URINALYSIS, ROUTINE W REFLEX MICROSCOPIC
Bilirubin, UA: NEGATIVE
GLUCOSE, UA: NEGATIVE
Ketones, UA: NEGATIVE
LEUKOCYTES UA: NEGATIVE
Nitrite, UA: NEGATIVE
PH UA: 5 (ref 5.0–7.5)
PROTEIN UA: NEGATIVE
RBC, UA: NEGATIVE
Specific Gravity, UA: 1.02 (ref 1.005–1.030)
Urobilinogen, Ur: 0.2 mg/dL (ref 0.2–1.0)

## 2017-04-10 LAB — MICROSCOPIC EXAMINATION
BACTERIA UA: NONE SEEN
RBC, UA: NONE SEEN /hpf (ref 0–?)

## 2017-04-10 NOTE — Progress Notes (Signed)
BP 104/71   Pulse 68   Ht 5' 3.39" (1.61 m)   Wt 130 lb (59 kg)   SpO2 98%   BMI 22.75 kg/m    Subjective:    Patient ID: Natasha HanksStacy Kennedy, female    DOB: [birth year], 56 y.o.   MRN: [id number]  HPI: Natasha HanksStacy Kennedy is a 56 y.o. female  Chief Complaint  Patient presents with  . Annual Exam   [facility] and nerve issues are stable and doing well still teaching some break right now. Getting ready to go back in July.  Relevant past medical, surgical, family and social history reviewed and updated as indicated. Interim medical history since our last visit reviewed. Allergies and medications reviewed and updated.  Review of Systems  Constitutional: Negative.   HENT: Negative.   Eyes: Negative.   Respiratory: Negative.   Cardiovascular: Negative.   Gastrointestinal: Negative.   Endocrine: Negative.   Genitourinary: Negative.   Musculoskeletal: Negative.   Skin: Negative.   Allergic/Immunologic: Negative.   Neurological: Negative.   Hematological: Negative.   Psychiatric/Behavioral: Negative.     Per HPI unless specifically indicated above     Objective:    BP 104/71   Pulse 68   Ht 5' 3.39" (1.61 m)   Wt 130 lb (59 kg)   SpO2 98%   BMI 22.75 kg/m   Wt Readings from Last 3 Encounters:  04/10/17 130 lb (59 kg)  01/18/17 130 lb 12.8 oz (59.3 kg)  06/26/16 119 lb (54 kg)    Physical Exam  Constitutional: She is oriented to person, place, and time. She appears well-developed and well-nourished.  HENT:  Head: Normocephalic and atraumatic.  Right Ear: External ear normal.  Left Ear: External ear normal.  Nose: Nose normal.  Mouth/Throat: Oropharynx is clear and moist.  Eyes: Conjunctivae and EOM are normal. Pupils are equal, round, and reactive to light.  Neck: Normal range of motion. Neck supple. Carotid bruit is not present.  Cardiovascular: Normal rate, regular rhythm and normal heart sounds.   No murmur heard. Pulmonary/Chest: Effort  normal and breath sounds normal.  Breast exam and gyn  Abdominal: Soft. Bowel sounds are normal. There is no hepatosplenomegaly.  Musculoskeletal: Normal range of motion.  Neurological: She is alert and oriented to person, place, and time.  Skin: No rash noted.  Psychiatric: She has a normal mood and affect. Her behavior is normal. Judgment and thought content normal.    Results for orders placed or performed in visit on 01/18/17  Veritor Flu A/B Waived  Result Value Ref Range   Influenza A Negative Negative   Influenza B Negative Negative      Assessment & Plan:   Problem List Items Addressed This Visit    None    Visit Diagnoses    Annual physical exam    -  Primary   Relevant Orders   CBC with Differential/Platelet   Comprehensive metabolic panel   Lipid panel   TSH   Urinalysis, Routine w reflex microscopic   Screening cholesterol level       Relevant Orders   Lipid panel   Screening for diabetes mellitus (DM)       Relevant Orders   CBC with Differential/Platelet   Thyroid disorder screen       Relevant Orders   TSH   Need for hepatitis C screening test       Relevant Orders   Hepatitis C antibody   Encounter for screening  for HIV       Relevant Orders   HIV antibody   Need for Tdap vaccination       Relevant Orders   Td : Tetanus/diphtheria >7yo Preservative  free       Follow up plan: Return in about 1 year (around 04/10/2018) for Physical Exam.

## 2017-04-11 ENCOUNTER — Encounter: Payer: Self-pay | Admitting: Family Medicine

## 2017-04-11 LAB — COMPREHENSIVE METABOLIC PANEL
ALBUMIN: 4 g/dL (ref 3.5–5.5)
ALT: 19 IU/L (ref 0–32)
AST: 20 IU/L (ref 0–40)
Albumin/Globulin Ratio: 1.4 (ref 1.2–2.2)
Alkaline Phosphatase: 46 IU/L (ref 39–117)
BUN / CREAT RATIO: 18 (ref 9–23)
BUN: 18 mg/dL (ref 6–24)
CHLORIDE: 102 mmol/L (ref 96–106)
CO2: 27 mmol/L (ref 20–29)
Calcium: 9.9 mg/dL (ref 8.7–10.2)
Creatinine, Ser: 0.99 mg/dL (ref 0.57–1.00)
GFR calc Af Amer: 75 mL/min/{1.73_m2} (ref >59)
GFR calc non Af Amer: 65 mL/min/{1.73_m2} (ref >59)
GLOBULIN, TOTAL: 2.8 g/dL (ref 1.5–4.5)
Glucose: 108 mg/dL — ABNORMAL HIGH (ref 65–99)
Potassium: 4 mmol/L (ref 3.5–5.2)
SODIUM: 142 mmol/L (ref 134–144)
Total Protein: 6.8 g/dL (ref 6.0–8.5)

## 2017-04-11 LAB — CBC WITH DIFFERENTIAL/PLATELET
Basophils Absolute: 0.1 10*3/uL (ref 0.0–0.2)
Basos: 1 %
EOS (ABSOLUTE): 0.4 10*3/uL (ref 0.0–0.4)
Eos: 6 %
HEMATOCRIT: 39.1 % (ref 34.0–46.6)
HEMOGLOBIN: 12.9 g/dL (ref 11.1–15.9)
Immature Grans (Abs): 0 10*3/uL (ref 0.0–0.1)
Immature Granulocytes: 0 %
LYMPHS ABS: 2.3 10*3/uL (ref 0.7–3.1)
Lymphs: 41 %
MCH: 29.2 pg (ref 26.6–33.0)
MCHC: 33 g/dL (ref 31.5–35.7)
MCV: 89 fL (ref 79–97)
MONOCYTES: 5 %
MONOS ABS: 0.3 10*3/uL (ref 0.1–0.9)
NEUTROS ABS: 2.6 10*3/uL (ref 1.4–7.0)
Neutrophils: 47 %
Platelets: 222 10*3/uL (ref 150–379)
RBC: 4.42 x10E6/uL (ref 3.77–5.28)
RDW: 14.5 % (ref 12.3–15.4)
WBC: 5.6 10*3/uL (ref 3.4–10.8)

## 2017-04-11 LAB — LIPID PANEL
CHOLESTEROL TOTAL: 184 mg/dL (ref 100–199)
Chol/HDL Ratio: 2.7 ratio (ref 0.0–4.4)
HDL: 67 mg/dL (ref >39)
LDL Calculated: 95 mg/dL (ref 0–99)
TRIGLYCERIDES: 109 mg/dL (ref 0–149)
VLDL CHOLESTEROL CAL: 22 mg/dL (ref 5–40)

## 2017-04-11 LAB — TSH: TSH: 2.14 u[IU]/mL (ref 0.450–4.500)

## 2017-04-11 LAB — HEPATITIS C ANTIBODY: Hep C Virus Ab: <0.1 s/co ratio (ref 0.0–0.9)

## 2017-04-11 LAB — HIV ANTIBODY (ROUTINE TESTING W REFLEX): HIV Screen 4th Generation wRfx: NONREACTIVE

## 2017-11-30 ENCOUNTER — Other Ambulatory Visit: Payer: Self-pay | Admitting: Certified Nurse Midwife

## 2017-12-19 ENCOUNTER — Encounter: Payer: Self-pay | Admitting: Certified Nurse Midwife

## 2017-12-19 ENCOUNTER — Ambulatory Visit: Payer: BC Managed Care – PPO | Admitting: Certified Nurse Midwife

## 2018-02-18 ENCOUNTER — Ambulatory Visit: Payer: BC Managed Care – PPO | Admitting: Certified Nurse Midwife

## 2018-04-03 ENCOUNTER — Encounter: Payer: Self-pay | Admitting: Certified Nurse Midwife

## 2018-04-03 ENCOUNTER — Ambulatory Visit (INDEPENDENT_AMBULATORY_CARE_PROVIDER_SITE_OTHER): Payer: BC Managed Care – PPO | Admitting: Certified Nurse Midwife

## 2018-04-03 VITALS — BP 110/70 | HR 64 | Ht 63.0 in | Wt 136.0 lb

## 2018-04-03 DIAGNOSIS — Z1231 Encounter for screening mammogram for malignant neoplasm of breast: Secondary | ICD-10-CM | POA: Diagnosis not present

## 2018-04-03 DIAGNOSIS — Z01419 Encounter for gynecological examination (general) (routine) without abnormal findings: Secondary | ICD-10-CM | POA: Diagnosis not present

## 2018-04-03 DIAGNOSIS — Z124 Encounter for screening for malignant neoplasm of cervix: Secondary | ICD-10-CM

## 2018-04-03 DIAGNOSIS — Z1239 Encounter for other screening for malignant neoplasm of breast: Secondary | ICD-10-CM

## 2018-04-03 NOTE — Patient Instructions (Signed)
Preventing Osteoporosis, Adult Osteoporosis is a condition that causes the bones to get weaker. With osteoporosis, the bones become thinner, and the normal spaces in bone tissue become larger. This can make the bones weak and cause them to break more easily. People who have osteoporosis are more likely to break their wrist, spine, or hip. Even a minor accident or injury can be enough to break weak bones. Osteoporosis can occur with aging. Your body constantly replaces old bone tissue with new tissue. As you get older, you may lose bone tissue more quickly, or it may be replaced more slowly. Osteoporosis is more likely to develop if you have poor nutrition or do not get enough calcium or vitamin D. Other lifestyle factors can also play a role. By making some diet and lifestyle changes, you can help to keep your bones healthy and help to prevent osteoporosis. What nutrition changes can be made? Nutrition plays an important role in maintaining healthy, strong bones.  Make sure you get enough calcium every day from food or from calcium supplements. ? If you are age 50 or younger, aim to get 1,000 mg of calcium every day. ? If you are older than age 50, aim to get 1,200 mg of calcium every day.  Try to get enough vitamin D every day. ? If you are age 70 or younger, aim to get 600 international units (IU) every day. ? If you are older than age 70, aim to get 800 international units every day.  Follow a healthy diet. Eat plenty of foods that contain calcium and vitamin D. ? Calcium is in milk, cheese, yogurt, and other dairy products. Some fish and vegetables are also good sources of calcium. Many foods such as cereals and breads have had calcium added to them (are fortified). Check nutrition labels to see how much calcium is in a food or drink. ? Foods that contain vitamin D include milk, cereals, salmon, and tuna. Your body also makes vitamin D when you are out in the sun. Bare skin exposure to the sun on  your face, arms, legs, or back for no more than 30 minutes a day, 2 times per week is more than enough. Beyond that, it is important to use sunblock to protect your skin from sunburn, which increases your risk for skin cancer.  What lifestyle changes can be made? Making changes in your everyday life can also play an important role in preventing osteoporosis.  Stay active and get exercise every day. Ask your health care provider what types of exercise are best for you.  Do not use any products that contain nicotine or tobacco, such as cigarettes and e-cigarettes. If you need help quitting, ask your health care provider.  Limit alcohol intake to no more than 1 drink a day for nonpregnant women and 2 drinks a day for men. One drink equals 12 oz of beer, 5 oz of wine, or 1 oz of hard liquor.  Why are these changes important? Making these nutrition and lifestyle changes can:  Help you develop and maintain healthy, strong bones.  Prevent loss of bone mass and the problems that are caused by that loss, such as broken bones and delayed healing.  Make you feel better mentally and physically.  What can happen if changes are not made? Problems that can result from osteoporosis can be very serious. These may include:  A higher risk of broken bones that are painful and do not heal well.  Physical malformations, such as   a collapsed spine or a hunched back.  Problems with movement.  Where to find support: If you need help making changes to prevent osteoporosis, talk with your health care provider. You can ask for a referral to a diet and nutrition specialist (dietitian) and a physical therapist. Where to find more information: Learn more about osteoporosis from:  NIH Osteoporosis and Related Bone Diseases National Resource Center: www.niams.nih.gov/health_info/bone/osteoporosis/osteoporosis_ff.asp  U.S. Office on Women's Health:  www.womenshealth.gov/publications/our-publications/fact-sheet/osteoporosis.html  National Osteoporosis Foundation: www.nof.org/patients/what-is-osteoporosis/  Summary  Osteoporosis is a condition that causes weak bones that are more likely to break.  Eating a healthy diet and making sure you get enough calcium and vitamin D can help prevent osteoporosis.  Other ways to reduce your risk of osteoporosis include getting regular exercise and avoiding alcohol and products that contain nicotine or tobacco. This information is not intended to replace advice given to you by your health care provider. Make sure you discuss any questions you have with your health care provider. Document Released: 10/31/2015 Document Revised: 06/26/2016 Document Reviewed: 06/26/2016 Elsevier Interactive Patient Education  2018 Elsevier Inc.  

## 2018-04-03 NOTE — Progress Notes (Addendum)
Gynecology Annual Exam  PCP: Steele Sizer, MD  Chief Complaint:  Chief Complaint  Patient presents with  . Gynecologic Exam    History of Present Illness: Natasha Kennedy is a 57 y.o. Caucasian/White female, G4 P2022, who presents for her annual exam. She is having some problems with painful intercourse due to vaginal dryness. She does use lubricant with some relief. She is now menopausal and her LMP was August 2017.  She denies hot flashes.   She states that her daughter's husband died in a car accident in the last year.  The patient's past medical history is notable for a history of anxiety/depression/OCD.   Her most recent annual exam was 05/03/2016 and her most recent pap smear was obtained 05/03/2016 and was NIL.  Her most recent mammogram obtained on 06/29/2016 was normal.  There is a positive history of breast cancer in her mother, maternal grandmother, and paternal aunt . Genetic testing has not been done. Lifetime risk of breast cancer acccording to the TC model is 26.6%. She has been counseled on options for increased surveillance for breast cancer and for genetic testing and has declined.  There is no family history of ovarian cancer.  The patient does not do monthly self breast exams.  She had a colonoscopy in 2015 which was normal. The patient does not smoke.  The patient does not drink alcohol. The patient does not use illegal drugs.  The patient just started back exercising regularly by running and walking  The patient does get some calcium in her diet.  She had a recent cholesterol screen in 2018 by PCP Vonita Moss that was normal.     Review of Systems: Review of Systems  Constitutional: Negative for chills, fever and weight loss.  HENT: Negative for congestion, sinus pain and sore throat.   Eyes: Negative for blurred vision and pain.  Respiratory: Negative for hemoptysis, shortness of breath and wheezing.   Cardiovascular: Negative for chest pain,  palpitations and leg swelling.  Gastrointestinal: Positive for constipation. Negative for abdominal pain, blood in stool, diarrhea, heartburn, nausea and vomiting.  Genitourinary: Negative for dysuria, frequency, hematuria and urgency.       Positive for dyspareunia  Musculoskeletal: Negative for back pain, joint pain and myalgias.  Skin: Positive for rash (psoriasis). Negative for itching.  Neurological: Negative for dizziness, tingling and headaches.  Endo/Heme/Allergies: Negative for environmental allergies and polydipsia. Does not bruise/bleed easily.       Negative for hirsutism   Psychiatric/Behavioral: Positive for depression. The patient is nervous/anxious. The patient does not have insomnia.     Past Medical History:  Past Medical History:  Diagnosis Date  . Anxiety   . Depression   . Ectopic pregnancy 1994  . Family history of breast cancer 2013   20% lifetime risk with 6% chance in next 10 years  . OCD (obsessive compulsive disorder)   . Plantar fasciitis of left foot   . Psoriasis     Past Surgical History:  Past Surgical History:  Procedure Laterality Date  . ABDOMINAL SURGERY    . CHOLECYSTECTOMY    . COLONOSCOPY  01/2014   negative  . ECTOPIC PREGNANCY SURGERY    . RHINOPLASTY    . WISDOM TOOTH EXTRACTION      Family History:  Family History  Problem Relation Age of Onset  . Heart disease Father        heart attack  . Hyperlipidemia Father   . Hypertension Father   .  Breast cancer Mother 49  . Heart disease Mother        pacemaker  . Hypertension Mother   . Hypertension Sister   . Hypertension Brother   . Breast cancer Maternal Grandmother 61  . Heart disease Paternal Grandmother   . Breast cancer Paternal Aunt 19    Social History:  Social History   Socioeconomic History  . Marital status: Married    Spouse name: Not on file  . Number of children: Not on file  . Years of education: Not on file  . Highest education level: Not on file    Occupational History  . Not on file  Social Needs  . Financial resource strain: Not on file  . Food insecurity:    Worry: Not on file    Inability: Not on file  . Transportation needs:    Medical: Not on file    Non-medical: Not on file  Tobacco Use  . Smoking status: Never Smoker  . Smokeless tobacco: Never Used  Substance and Sexual Activity  . Alcohol use: No    Comment: very limited  . Drug use: No    Comment: Patient denies   . Sexual activity: Yes    Birth control/protection: Condom  Lifestyle  . Physical activity:    Days per week: 0 days    Minutes per session: 0 min  . Stress: To some extent  Relationships  . Social connections:    Talks on phone: Not on file    Gets together: Not on file    Attends religious service: Not on file    Active member of club or organization: Not on file    Attends meetings of clubs or organizations: Not on file    Relationship status: Not on file  . Intimate partner violence:    Fear of current or ex partner: Not on file    Emotionally abused: Not on file    Physically abused: Not on file    Forced sexual activity: Not on file  Other Topics Concern  . Not on file  Social History Narrative  . Not on file    Allergies:  No Known Allergies  Medications: Prior to Admission medications   Medication Sig Start Date End Date Taking? Authorizing Provider  ALPRAZolam Prudy Feeler) 0.5 MG tablet Take 1 tablet (0.5 mg total) by mouth 2 (two) times daily. For anxiety 06/28/15   Armandina Stammer I, NP  busPIRone (BUSPAR) 30 MG tablet Take 1 tablet (30 mg total) by mouth 2 (two) times daily. For anxiety 06/28/15   Armandina Stammer I, NP  clomiPRAMINE (ANAFRANIL) 25 MG capsule  01/05/17   [provider]  Fluvoxamine Maleate 150 MG CP24 Take 1 capsule (150 mg total) by mouth 2 (two) times daily. For depression 06/28/15   Armandina Stammer I, NP  Also Miralax, Melatonin, magnesium, and multivitamin  Physical Exam Vitals: BP 110/70   Pulse 64   Ht 5'  3" (1.6 m)   Wt 136 lb (61.7 kg)   BMI 24.09 kg/m   General: WF in NAD HEENT: normocephalic, anicteric Neck: no thyroid enlargement, no palpable nodules, no cervical lymphadenopathy  Pulmonary: No increased work of breathing, CTAB Cardiovascular: RRR, without murmur  Breast: Breast symmetrical, no tenderness, no palpable nodules or masses, no skin or nipple retraction present, no nipple discharge.  No axillary, infraclavicular or supraclavicular lymphadenopathy. Abdomen: Soft, non-tender, non-distended.  Umbilicus without lesions.  No hepatomegaly or masses palpable. No evidence of hernia. Genitourinary:  External:  Normal external female genitalia.  Normal urethral meatus, normal Bartholin's and Skene's glands.    Vagina: Normal vaginal mucosa, no evidence of prolapse.    Cervix: Grossly normal in appearance, no bleeding, non-tender  Uterus: Anteverted, normal size, shape, and consistency, mobile, and non-tender  Adnexa: No adnexal masses, non-tender  Rectal: deferred  Lymphatic: no evidence of inguinal lymphadenopathy Extremities: no edema, erythema, or tenderness Neurologic: Grossly intact Psychiatric: mood appropriate, affect full     Assessment: 57 y.o. [id number] annual gyn exam Family history of breast cancer  Plan:   1) Breast cancer screening - recommend monthly self breast exam. Mammogram was ordered today. Offered genetic testing again due to increased lifetime risk of breast cancer and patient decines  2) Explained that there are options for treatment of vaginal dryness -patient wants to continue using lubricants  3) Cervical cancer screening - Pap was done.   4) Contraception - no longer needed postmenopausal  5) Routine healthcare maintenance including cholesterol and diabetes screening managed by PCP   6) Discussed calcium and vitamin D3 requirements and the role of exercise in preventing osteoporosis   7) Colon cancer screening: colonoscopy UTD   Farrel Connersolleen  Jodi Criscuolo, CNM

## 2018-04-06 ENCOUNTER — Encounter: Payer: Self-pay | Admitting: Certified Nurse Midwife

## 2018-04-06 LAB — IGP, APTIMA HPV
HPV Aptima: NEGATIVE
PAP SMEAR COMMENT: 0

## 2018-04-08 ENCOUNTER — Ambulatory Visit
Admission: RE | Admit: 2018-04-08 | Discharge: 2018-04-08 | Disposition: A | Discharge status: Home / Self Care | Payer: BC Managed Care – PPO | Source: Ambulatory Visit | Attending: Certified Nurse Midwife | Admitting: Certified Nurse Midwife

## 2018-04-08 ENCOUNTER — Other Ambulatory Visit: Payer: Self-pay | Admitting: Neurology

## 2018-04-08 DIAGNOSIS — Z1231 Encounter for screening mammogram for malignant neoplasm of breast: Secondary | ICD-10-CM | POA: Diagnosis present

## 2018-04-08 DIAGNOSIS — R413 Other amnesia: Secondary | ICD-10-CM

## 2018-04-08 DIAGNOSIS — Z1239 Encounter for other screening for malignant neoplasm of breast: Secondary | ICD-10-CM

## 2018-04-11 ENCOUNTER — Encounter: Payer: Self-pay | Admitting: Family Medicine

## 2018-04-11 ENCOUNTER — Ambulatory Visit (INDEPENDENT_AMBULATORY_CARE_PROVIDER_SITE_OTHER): Payer: BC Managed Care – PPO | Admitting: Family Medicine

## 2018-04-11 VITALS — BP 100/68 | HR 76 | Temp 98.2°F | Ht 63.0 in | Wt 137.0 lb

## 2018-04-11 DIAGNOSIS — Z0001 Encounter for general adult medical examination with abnormal findings: Secondary | ICD-10-CM | POA: Diagnosis not present

## 2018-04-11 DIAGNOSIS — L309 Dermatitis, unspecified: Secondary | ICD-10-CM | POA: Insufficient documentation

## 2018-04-11 DIAGNOSIS — R413 Other amnesia: Secondary | ICD-10-CM | POA: Insufficient documentation

## 2018-04-11 DIAGNOSIS — F322 Major depressive disorder, single episode, severe without psychotic features: Secondary | ICD-10-CM | POA: Diagnosis not present

## 2018-04-11 DIAGNOSIS — Z0000 Encounter for general adult medical examination without abnormal findings: Secondary | ICD-10-CM

## 2018-04-11 LAB — URINALYSIS, ROUTINE W REFLEX MICROSCOPIC
BILIRUBIN UA: NEGATIVE
GLUCOSE, UA: NEGATIVE
KETONES UA: NEGATIVE
Nitrite, UA: NEGATIVE
PROTEIN UA: NEGATIVE
RBC UA: NEGATIVE
UUROB: 0.2 mg/dL (ref 0.2–1.0)
pH, UA: 5.5 (ref 5.0–7.5)

## 2018-04-11 LAB — MICROSCOPIC EXAMINATION
BACTERIA UA: NONE SEEN
RBC MICROSCOPIC, UA: NONE SEEN /HPF (ref 0–2)

## 2018-04-11 MED ORDER — CLOTRIMAZOLE-BETAMETHASONE 1-0.05 % EX LOTN: TOPICAL_LOTION | Freq: Two times a day (BID) | CUTANEOUS | 0 refills | Status: DC

## 2018-04-11 NOTE — Progress Notes (Signed)
BP 100/68   Pulse 76   Temp 98.2 F (36.8 C) (Oral)   Ht 5\' 3"  (1.6 m)   Wt 137 lb (62.1 kg)   SpO2 97%   BMI 24.27 kg/m    Subjective:    Patient ID: Natasha Kennedy, female    DOB: 1961/04/24, 57 y.o.   MRN: [id number]  HPI: Natasha Kennedy is a 57 y.o. female  Chief Complaint  Patient presents with  . Annual Exam  Patient with several concerns. One being fatigue and sleepiness sleeping a great deal now that school is out sometimes wakes up at 2 AM is up for several hours then is able to sleep again but sleeps until 10 then naps during the day and the cycle repeats itself. No reported sleep apnea type symptoms but will observe. Also having some memory loss issues had an appointment earlier this week with neurology has MRI scheduled next week. Working with psychiatry on medications.   Relevant past medical, surgical, family and social history reviewed and updated as indicated. Interim medical history since our last visit reviewed. Allergies and medications reviewed and updated.  Review of Systems  Constitutional: Negative.   HENT: Negative.   Eyes: Negative.   Respiratory: Negative.   Cardiovascular: Negative.   Gastrointestinal: Negative.   Endocrine: Negative.   Genitourinary: Negative.   Musculoskeletal: Negative.   Skin: Negative.   Allergic/Immunologic: Negative.   Neurological: Negative.   Hematological: Negative.   Psychiatric/Behavioral: Negative.     Per HPI unless specifically indicated above     Objective:    BP 100/68   Pulse 76   Temp 98.2 F (36.8 C) (Oral)   Ht 5\' 3"  (1.6 m)   Wt 137 lb (62.1 kg)   SpO2 97%   BMI 24.27 kg/m   Wt Readings from Last 3 Encounters:  04/11/18 137 lb (62.1 kg)  04/03/18 136 lb (61.7 kg)  04/10/17 130 lb (59 kg)  Patient's also completed GYN exam including mammogram which was reported as normal. Physical Exam  Constitutional: She is oriented to person, place, and time. She appears well-developed and  well-nourished.  HENT:  Head: Normocephalic and atraumatic.  Right Ear: External ear normal.  Left Ear: External ear normal.  Nose: Nose normal.  Mouth/Throat: Oropharynx is clear and moist.  Eyes: Pupils are equal, round, and reactive to light. Conjunctivae and EOM are normal.  Neck: Normal range of motion. Neck supple. Carotid bruit is not present.  Cardiovascular: Normal rate, regular rhythm and normal heart sounds.  No murmur heard. Pulmonary/Chest: Effort normal and breath sounds normal.  Abdominal: Soft. Bowel sounds are normal. There is no hepatosplenomegaly.  Musculoskeletal: Normal range of motion.  Neurological: She is alert and oriented to person, place, and time.  Skin: No rash noted.  Psychiatric: She has a normal mood and affect. Her behavior is normal. Judgment and thought content normal.    Results for orders placed or performed in visit on 04/03/18  IGP, Aptima HPV  Result Value Ref Range   DIAGNOSIS: Comment    Specimen adequacy: Comment    Clinician Provided ICD10 Comment    Performed by: Comment    PAP Smear Comment .    Note: Comment    Test Methodology Comment    HPV Aptima Negative Negative      Assessment & Plan:   Problem List Items Addressed This Visit      Nervous and Auditory   Dermatitis of external ear    Will give  Lotrisone lotion        Other   MDD (major depressive disorder)    Followed by psy  Discussed also possibility of drug side effects may be exacerbating patient's constellation of symptoms.      Relevant Orders   Comprehensive metabolic panel   Lipid panel   CBC with Differential/Platelet   TSH   Urinalysis, Routine w reflex microscopic   VITAMIN D 25 Hydroxy (Vit-D Deficiency, Fractures)   Vitamin B12   Folate   RPR   Vitamin B1   Memory loss    Memory loss work-up in progress with neurology will draw blood work.      Relevant Orders   Comprehensive metabolic panel   Lipid panel   CBC with Differential/Platelet    TSH   Urinalysis, Routine w reflex microscopic   VITAMIN D 25 Hydroxy (Vit-D Deficiency, Fractures)   Vitamin B12   Folate   RPR   Vitamin B1    Other Visit Diagnoses    PE (physical exam), annual    -  Primary   Relevant Orders   Comprehensive metabolic panel   Lipid panel   CBC with Differential/Platelet   TSH   Urinalysis, Routine w reflex microscopic   VITAMIN D 25 Hydroxy (Vit-D Deficiency, Fractures)   Vitamin B12   Folate   RPR   Vitamin B1       Follow up plan: Return in about 1 year (around 04/12/2019).

## 2018-04-11 NOTE — Assessment & Plan Note (Signed)
Will give Lotrisone lotion

## 2018-04-11 NOTE — Assessment & Plan Note (Signed)
Memory loss work-up in progress with neurology will draw blood work.

## 2018-04-11 NOTE — Assessment & Plan Note (Signed)
Followed by psy  Discussed also possibility of drug side effects may be exacerbating patient's constellation of symptoms.

## 2018-04-15 ENCOUNTER — Encounter: Payer: Self-pay | Admitting: Family Medicine

## 2018-04-15 LAB — CBC WITH DIFFERENTIAL/PLATELET
Basophils Absolute: 0.1 10*3/uL (ref 0.0–0.2)
Basos: 1 %
EOS (ABSOLUTE): 0.3 10*3/uL (ref 0.0–0.4)
EOS: 6 %
HEMATOCRIT: 38.2 % (ref 34.0–46.6)
HEMOGLOBIN: 12.7 g/dL (ref 11.1–15.9)
IMMATURE GRANULOCYTES: 0 %
Immature Grans (Abs): 0 10*3/uL (ref 0.0–0.1)
LYMPHS: 36 %
Lymphocytes Absolute: 1.9 10*3/uL (ref 0.7–3.1)
MCH: 28.7 pg (ref 26.6–33.0)
MCHC: 33.2 g/dL (ref 31.5–35.7)
MCV: 86 fL (ref 79–97)
MONOCYTES: 6 %
Monocytes Absolute: 0.3 10*3/uL (ref 0.1–0.9)
NEUTROS PCT: 51 %
Neutrophils Absolute: 2.7 10*3/uL (ref 1.4–7.0)
Platelets: 228 10*3/uL (ref 150–450)
RBC: 4.42 x10E6/uL (ref 3.77–5.28)
RDW: 13.8 % (ref 12.3–15.4)
WBC: 5.4 10*3/uL (ref 3.4–10.8)

## 2018-04-15 LAB — LIPID PANEL
Chol/HDL Ratio: 2.9 ratio (ref 0.0–4.4)
Cholesterol, Total: 200 mg/dL — ABNORMAL HIGH (ref 100–199)
HDL: 68 mg/dL (ref >39)
LDL Calculated: 118 mg/dL — ABNORMAL HIGH (ref 0–99)
Triglycerides: 71 mg/dL (ref 0–149)
VLDL Cholesterol Cal: 14 mg/dL (ref 5–40)

## 2018-04-15 LAB — COMPREHENSIVE METABOLIC PANEL
A/G RATIO: 1.6 (ref 1.2–2.2)
ALT: 40 IU/L — AB (ref 0–32)
AST: 26 IU/L (ref 0–40)
Albumin: 4.3 g/dL (ref 3.5–5.5)
Alkaline Phosphatase: 54 IU/L (ref 39–117)
BILIRUBIN TOTAL: 0.2 mg/dL (ref 0.0–1.2)
BUN/Creatinine Ratio: 16 (ref 9–23)
BUN: 16 mg/dL (ref 6–24)
CHLORIDE: 100 mmol/L (ref 96–106)
CO2: 27 mmol/L (ref 20–29)
Calcium: 10.1 mg/dL (ref 8.7–10.2)
Creatinine, Ser: 0.98 mg/dL (ref 0.57–1.00)
GFR calc non Af Amer: 66 mL/min/{1.73_m2} (ref >59)
GFR, EST AFRICAN AMERICAN: 76 mL/min/{1.73_m2} (ref >59)
GLOBULIN, TOTAL: 2.7 g/dL (ref 1.5–4.5)
Glucose: 88 mg/dL (ref 65–99)
POTASSIUM: 3.8 mmol/L (ref 3.5–5.2)
SODIUM: 141 mmol/L (ref 134–144)
TOTAL PROTEIN: 7 g/dL (ref 6.0–8.5)

## 2018-04-15 LAB — VITAMIN D 25 HYDROXY (VIT D DEFICIENCY, FRACTURES): VIT D 25 HYDROXY: 36.6 ng/mL (ref 30.0–100.0)

## 2018-04-15 LAB — TSH: TSH: 1.62 u[IU]/mL (ref 0.450–4.500)

## 2018-04-15 LAB — VITAMIN B1: Thiamine: 254.1 nmol/L — ABNORMAL HIGH (ref 66.5–200.0)

## 2018-04-15 LAB — VITAMIN B12: Vitamin B-12: 1550 pg/mL — ABNORMAL HIGH (ref 232–1245)

## 2018-04-15 LAB — FOLATE: Folate: >20 ng/mL (ref >3.0)

## 2018-04-15 LAB — RPR: RPR Ser Ql: NONREACTIVE

## 2018-04-19 ENCOUNTER — Ambulatory Visit
Admission: RE | Admit: 2018-04-19 | Discharge: 2018-04-19 | Disposition: A | Discharge status: Home / Self Care | Payer: BC Managed Care – PPO | Source: Ambulatory Visit | Attending: Neurology | Admitting: Neurology

## 2018-04-19 DIAGNOSIS — R413 Other amnesia: Secondary | ICD-10-CM | POA: Diagnosis not present

## 2018-04-19 MED ORDER — GADOBENATE DIMEGLUMINE 529 MG/ML IV SOLN
10.0000 mL | Freq: Once | INTRAVENOUS | Status: AC | PRN
  Administered 2018-04-19: 10 mL via INTRAVENOUS

## 2018-09-17 ENCOUNTER — Ambulatory Visit: Payer: BC Managed Care – PPO | Admitting: Physician Assistant

## 2018-09-24 ENCOUNTER — Other Ambulatory Visit: Payer: Self-pay | Admitting: Physician Assistant

## 2018-09-24 NOTE — Telephone Encounter (Signed)
Need paper chart  

## 2018-10-10 ENCOUNTER — Encounter: Payer: Self-pay | Admitting: Emergency Medicine

## 2018-10-10 DIAGNOSIS — F411 Generalized anxiety disorder: Secondary | ICD-10-CM | POA: Insufficient documentation

## 2018-10-18 ENCOUNTER — Encounter: Payer: Self-pay | Admitting: Physician Assistant

## 2018-10-18 ENCOUNTER — Ambulatory Visit (INDEPENDENT_AMBULATORY_CARE_PROVIDER_SITE_OTHER): Payer: BC Managed Care – PPO | Admitting: Physician Assistant

## 2018-10-18 DIAGNOSIS — F411 Generalized anxiety disorder: Secondary | ICD-10-CM | POA: Diagnosis not present

## 2018-10-18 DIAGNOSIS — F422 Mixed obsessional thoughts and acts: Secondary | ICD-10-CM

## 2018-10-18 DIAGNOSIS — F331 Major depressive disorder, recurrent, moderate: Secondary | ICD-10-CM

## 2018-10-18 MED ORDER — CLOMIPRAMINE HCL 50 MG PO CAPS: 50.0000 mg | ORAL_CAPSULE | Freq: Every day | ORAL | 5 refills | Status: DC

## 2018-10-18 MED ORDER — BUSPIRONE HCL 30 MG PO TABS: 30.0000 mg | ORAL_TABLET | Freq: Two times a day (BID) | ORAL | 5 refills | Status: DC

## 2018-10-18 MED ORDER — FLUVOXAMINE MALEATE 100 MG PO TABS: 200.0000 mg | ORAL_TABLET | Freq: Two times a day (BID) | ORAL | 5 refills | Status: DC

## 2018-10-18 MED ORDER — ALPRAZOLAM 0.25 MG PO TABS: 0.2500 mg | ORAL_TABLET | Freq: Three times a day (TID) | ORAL | 5 refills | Status: DC | PRN

## 2018-10-18 NOTE — Progress Notes (Signed)
[facility]  Patient ID: Natasha Kennedy,  MRN: [id number]  PCP: Steele Sizerrissman, Mark A, MD  Date of Evaluation: 10/18/2018 Time spent:15 minutes  Chief Complaint:  Chief Complaint    Follow-up      HISTORY/CURRENT STATUS: HPI Here for routine med check.   Patient states she is doing very well.  Since she was last here and we had decreased the Xanax to 0.25 mg, her memory has improved.  Occasionally she will have a time where she will forget what she went into a room for for example.  But she has not got lost driving or had other major memory issues.  She feels like it was a good change to decrease the Xanax.  She also feels that she is not quite as weighed down.  She noticed that since decreasing the Xanax she feels "lighter" and it seems the Xanax was causing a little depression at times.  Patient denies loss of interest in usual activities and is able to enjoy things.  Denies decreased energy or motivation.  Appetite has not changed.  No extreme sadness, tearfulness, or feelings of hopelessness.  Denies any changes in concentration, making decisions or remembering things.  Denies suicidal or homicidal thoughts.  The OCD is well managed.  She is sleeping good.  Work is going very well and she will be in a new position when she goes back after Christmas break.  Individual Medical History/ Review of Systems: Changes? :No   Allergies: Patient has no known allergies.  Current Medications:  Current Outpatient Medications:  .  ALPRAZolam (XANAX) 0.25 MG tablet, Take 1 tablet (0.25 mg total) by mouth 3 (three) times daily as needed., Disp: 90 tablet, Rfl: 5 .  busPIRone (BUSPAR) 30 MG tablet, Take 1 tablet (30 mg total) by mouth 2 (two) times daily. For anxiety, Disp: 60 tablet, Rfl: 5 .  clomiPRAMINE (ANAFRANIL) 50 MG capsule, Take 1 capsule (50 mg total) by mouth at bedtime., Disp: 30 capsule, Rfl: 5 .  fluvoxaMINE (LUVOX) 100 MG tablet, Take 2 tablets (200 mg total) by mouth 2  (two) times daily., Disp: 120 tablet, Rfl: 5 Medication Side Effects: none  Family Medical/ Social History: Changes? 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  The principal created a position for her b/c she didn't want her to leave.   MENTAL HEALTH EXAM:  There were no vitals taken for this visit.There is no height or weight on file to calculate BMI.  General Appearance: Casual and Well Groomed  Eye Contact:  Good  Speech:  Clear and Coherent  Volume:  Normal  Mood:  Euthymic  Affect:  Appropriate  Thought Process:  Goal Directed  Orientation:  Full (Time, Place, and Person)  Thought Content: Logical   Suicidal Thoughts:  No  Homicidal Thoughts:  No  Memory:  WNL  Judgement:  Good  Insight:  Good  Psychomotor Activity:  Normal  Concentration:  Concentration: Good  Recall:  Good  Fund of Knowledge: Good  Language: Good  Assets:  Desire for Improvement  ADL's:  Intact  Cognition: WNL  Prognosis:  Good    DIAGNOSES:    ICD-10-CM   1. Generalized anxiety disorder F41.1   2. Mixed obsessional thoughts and acts F42.2   3. Major depressive disorder, recurrent episode, moderate (HCC) F33.1     Receiving Psychotherapy: No    RECOMMENDATIONS: I am glad to see her doing so well! Continue all meds  as noted above. Return in 6 months or sooner as needed.   Melony Overlyeresa Danyele Smejkal, PA-C

## 2019-04-15 ENCOUNTER — Encounter: Payer: BC Managed Care – PPO | Admitting: Family Medicine

## 2019-04-18 ENCOUNTER — Encounter: Payer: Self-pay | Admitting: Physician Assistant

## 2019-04-18 ENCOUNTER — Ambulatory Visit: Payer: BC Managed Care – PPO | Admitting: Physician Assistant

## 2019-04-18 ENCOUNTER — Other Ambulatory Visit: Payer: Self-pay

## 2019-04-18 DIAGNOSIS — F331 Major depressive disorder, recurrent, moderate: Secondary | ICD-10-CM | POA: Diagnosis not present

## 2019-04-18 DIAGNOSIS — F411 Generalized anxiety disorder: Secondary | ICD-10-CM

## 2019-04-18 DIAGNOSIS — F422 Mixed obsessional thoughts and acts: Secondary | ICD-10-CM | POA: Diagnosis not present

## 2019-04-18 MED ORDER — ALPRAZOLAM 0.25 MG PO TABS: 0.2500 mg | ORAL_TABLET | Freq: Three times a day (TID) | ORAL | 5 refills | Status: DC | PRN

## 2019-04-18 MED ORDER — BUSPIRONE HCL 30 MG PO TABS: 30.0000 mg | ORAL_TABLET | Freq: Two times a day (BID) | ORAL | 5 refills | Status: DC

## 2019-04-18 MED ORDER — CLOMIPRAMINE HCL 50 MG PO CAPS: 50.0000 mg | ORAL_CAPSULE | Freq: Every day | ORAL | 5 refills | Status: DC

## 2019-04-18 MED ORDER — FLUVOXAMINE MALEATE 100 MG PO TABS: 200.0000 mg | ORAL_TABLET | Freq: Two times a day (BID) | ORAL | 5 refills | Status: DC

## 2019-04-18 NOTE — Progress Notes (Signed)
[facility]  Patient ID: Natasha Kennedy,  MRN: [id number]  PCP: Guadalupe Maple, MD  Date of Evaluation: 04/18/2019 Time spent:15 minutes  Chief Complaint:  Chief Complaint    Depression; Follow-up      HISTORY/CURRENT STATUS: HPI For 6 month med check.   Doing well for the most part. Stressed w/ job but out now for the summer. Going to the beach next week and is looking forward to that.  Dates she feels down off and on, especially thinking about her job and choices she has made in the past 8 years.  Her husband has told her not to look back at those things because she cannot change it anyway.  States she is sleeping well but does sleep more right now.  Not sure if it is because she is a little down or the fact that she is not on a schedule because school is out.  Thinks it may be a little bit of both.  Is able to enjoy some things when she has the chance.  Energy and motivation are good.  She does isolate some mostly because of the coronavirus pandemic.  Does not cry easily.  No suicidal or homicidal thoughts.  Obsessive thoughts are still present but not as bad as they have been in the past.  No compulsions.  Anxiety is controlled with the Xanax.  Denies dizziness, syncope, seizures, numbness, tingling, tremor, tics, unsteady gait, slurred speech, confusion.  Denies muscle or joint pain, stiffness, or dystonia.  Individual Medical History/ Review of Systems: Changes? :No    Past medications for mental health diagnoses include: unknown  Allergies: Patient has no known allergies.  Current Medications:  Current Outpatient Medications:  .  ALPRAZolam (XANAX) 0.25 MG tablet, Take 1 tablet (0.25 mg total) by mouth 3 (three) times daily as needed., Disp: 90 tablet, Rfl: 5 .  busPIRone (BUSPAR) 30 MG tablet, Take 1 tablet (30 mg total) by mouth 2 (two) times daily. For anxiety, Disp: 60 tablet, Rfl: 5 .  clomiPRAMINE (ANAFRANIL) 50 MG capsule, Take 1 capsule (50 mg total) by  mouth at bedtime., Disp: 30 capsule, Rfl: 5 .  fluvoxaMINE (LUVOX) 100 MG tablet, Take 2 tablets (200 mg total) by mouth 2 (two) times daily., Disp: 120 tablet, Rfl: 5 Medication Side Effects: none  Family Medical/ Social History: Changes? No  MENTAL HEALTH EXAM:  There were no vitals taken for this visit.There is no height or weight on file to calculate BMI.  General Appearance: Casual  Eye Contact:  Good  Speech:  Clear and Coherent  Volume:  Normal  Mood:  Euthymic  Affect:  Appropriate  Thought Process:  Goal Directed  Orientation:  Full (Time, Place, and Person)  Thought Content: Logical   Suicidal Thoughts:  No  Homicidal Thoughts:  No  Memory:  WNL  Judgement:  Good  Insight:  Good  Psychomotor Activity:  Normal  Concentration:  Concentration: Good  Recall:  Good  Fund of Knowledge: Good  Language: Good  Assets:  Desire for Improvement  ADL's:  Intact  Cognition: WNL  Prognosis:  Good    DIAGNOSES:    ICD-10-CM   1. Major depressive disorder, recurrent episode, moderate (HCC)  F33.1   2. Generalized anxiety disorder  F41.1   3. Mixed obsessional thoughts and acts  F42.2     Receiving Psychotherapy: No    RECOMMENDATIONS:  Continue Xanax 0.25 mg 3 times daily as needed. Continue BuSpar 30 mg twice daily. Continue clomipramine  50 mg nightly. Continue Luvox 100 mg, 2 p.o. twice daily. Strongly recommend counseling. Return in 6 months.  Melony Overlyeresa Izabelle Daus, PA-C   This record has been created using [employer] software.  Chart creation errors have been sought, but may not always have been located and corrected. Such creation errors do not reflect on the standard of medical care.

## 2019-08-13 ENCOUNTER — Other Ambulatory Visit: Payer: Self-pay

## 2019-08-13 ENCOUNTER — Encounter: Payer: Self-pay | Admitting: Certified Nurse Midwife

## 2019-08-13 ENCOUNTER — Ambulatory Visit (INDEPENDENT_AMBULATORY_CARE_PROVIDER_SITE_OTHER): Payer: BC Managed Care – PPO | Admitting: Certified Nurse Midwife

## 2019-08-13 VITALS — BP 114/70 | HR 69 | Ht 63.0 in | Wt 147.0 lb

## 2019-08-13 DIAGNOSIS — Z1239 Encounter for other screening for malignant neoplasm of breast: Secondary | ICD-10-CM

## 2019-08-13 DIAGNOSIS — Z1231 Encounter for screening mammogram for malignant neoplasm of breast: Secondary | ICD-10-CM

## 2019-08-13 DIAGNOSIS — Z01419 Encounter for gynecological examination (general) (routine) without abnormal findings: Secondary | ICD-10-CM | POA: Diagnosis not present

## 2019-08-13 DIAGNOSIS — Z23 Encounter for immunization: Secondary | ICD-10-CM

## 2019-08-13 DIAGNOSIS — Z9189 Other specified personal risk factors, not elsewhere classified: Secondary | ICD-10-CM

## 2019-08-13 DIAGNOSIS — Z803 Family history of malignant neoplasm of breast: Secondary | ICD-10-CM

## 2019-08-13 NOTE — Progress Notes (Addendum)
Gynecology Annual Exam  PCP: Guadalupe Maple, MD  Chief Complaint:  Chief Complaint  Patient presents with  . Gynecologic Exam    History of Present Illness: Natasha Kennedy is a 58 y.o. Caucasian/White female, G4 P2022, who presents for her annual exam.  She is now menopausal and her LMP was August 2017.  She denies hot flashes.   She is sexually active and uses lubricates for vaginal dryness. The patient's past medical history is notable for a history of anxiety/depression/OCD/psoriasis.  Since her last exam 04/03/2018 she had a workup for tremors and memory loss including an MRI of the brain which was normal.   Her most recent annual exam was 04/03/2018 and her most recent pap smear was obtained 04/03/2018 and was NIL/ HRHPV.  Her most recent mammogram obtained on 01/06/2018 was normal.  There is a positive history of breast cancer in her mother, maternal grandmother, and paternal aunt. She also reports that her sister has breast cancer at age 24. Her sister had genetic testing and it was negative last year. Lifetime risk of breast cancer acccording to the TC model is 42.5%. She has been counseled on options for increased surveillance for breast cancer and for genetic testing and has declined.  There is no family history of ovarian cancer.  The patient does not do monthly self breast exams.  She had a colonoscopy in 2015 which was normal. Next is due in 10 years (2025) The patient does not smoke.  The patient does not drink alcohol. The patient does not use illegal drugs.  The patient decreased her exercise with the Covid pandemic, but just recently started back  exercising  by running and walking  The patient does get adequate calcium in her diet.  She had a recent cholesterol screen in 2019 by PCP Golden Pop that was normal.     Review of Systems: Review of Systems  Constitutional: Negative for chills, fever and weight loss.  HENT: Negative for congestion, sinus pain and sore  throat.   Eyes: Negative for blurred vision and pain.  Respiratory: Negative for hemoptysis, shortness of breath and wheezing.   Cardiovascular: Negative for chest pain, palpitations and leg swelling.  Gastrointestinal: Positive for constipation. Negative for abdominal pain, blood in stool, diarrhea, heartburn, nausea and vomiting.  Genitourinary: Negative for dysuria, frequency, hematuria and urgency.       Positive for dyspareunia  Musculoskeletal: Negative for back pain, joint pain and myalgias.  Skin: Positive for rash (psoriasis). Negative for itching.  Neurological: Positive for tremors. Negative for dizziness, tingling and headaches.  Endo/Heme/Allergies: Negative for environmental allergies and polydipsia. Does not bruise/bleed easily.       Negative for hirsutism   Psychiatric/Behavioral: Positive for depression. The patient is nervous/anxious. The patient does not have insomnia.     Past Medical History:  Past Medical History:  Diagnosis Date  . Anxiety   . Depression   . Ectopic pregnancy 1994  . Family history of breast cancer 2013   26.6% lifetime risk  . OCD (obsessive compulsive disorder)   . Plantar fasciitis of left foot   . Psoriasis     Past Surgical History:  Past Surgical History:  Procedure Laterality Date  . CHOLECYSTECTOMY  2008  . COLONOSCOPY  01/2014   negative  . ECTOPIC PREGNANCY SURGERY  1994  . RHINOPLASTY  1978  . WISDOM TOOTH EXTRACTION      Family History:  Family History  Problem Relation Age of  Onset  . Heart disease Father        heart attack/ cardiac arrest-2013  . Hyperlipidemia Father   . Hypertension Father   . Breast cancer Mother 6  . Heart disease Mother        pacemaker  . Hypertension Mother   . Hip fracture Mother   . Hypertension Sister   . Hypertension Brother   . Breast cancer Maternal Grandmother 48  . Heart disease Paternal Grandmother   . Breast cancer Sister 24  . Breast cancer Paternal Aunt 67    Social  History:  Social History   Socioeconomic History  . Marital status: Married    Spouse name: Not on file  . Number of children: 2  . Years of education: Not on file  . Highest education level: Not on file  Occupational History  . Occupation: first grade teacher  Social Needs  . Financial resource strain: Not on file  . Food insecurity    Worry: Not on file    Inability: Not on file  . Transportation needs    Medical: Not on file    Non-medical: Not on file  Tobacco Use  . Smoking status: Never Smoker  . Smokeless tobacco: Never Used  Substance and Sexual Activity  . Alcohol use: No    Comment: very limited  . Drug use: No    Comment: Patient denies   . Sexual activity: Yes    Birth control/protection: None  Lifestyle  . Physical activity    Days per week: 0 days    Minutes per session: 0 min  . Stress: To some extent  Relationships  . Social Musician on phone: Not on file    Gets together: Not on file    Attends religious service: Not on file    Active member of club or organization: Not on file    Attends meetings of clubs or organizations: Not on file    Relationship status: Not on file  . Intimate partner violence    Fear of current or ex partner: Not on file    Emotionally abused: Not on file    Physically abused: Not on file    Forced sexual activity: Not on file  Other Topics Concern  . Not on file  Social History Narrative  . Not on file    Allergies:  No Known Allergies  Medications: Prior to Admission medications   Medication Sig Start Date End Date Taking? Authorizing Provider  ALPRAZolam Prudy Feeler) 0.5 MG tablet Take 1 tablet (0.5 mg total) by mouth 2 (two) times daily. For anxiety 06/28/15   Armandina Stammer I, NP  busPIRone (BUSPAR) 30 MG tablet Take 1 tablet (30 mg total) by mouth 2 (two) times daily. For anxiety 06/28/15   Armandina Stammer I, NP  clomiPRAMINE (ANAFRANIL) 25 MG capsule  01/05/17   [provider]  Fluvoxamine Maleate  150 MG CP24 Take 1 capsule (150 mg total) by mouth 2 (two) times daily. For depression 06/28/15   Armandina Stammer I, NP  Also Miralax, Citrucel, Melatonin 5 mgm, and multivitamin  Physical Exam Vitals: BP 114/70   Pulse 69   Ht 5\' 3"  (1.6 m)   Wt 147 lb (66.7 kg)   BMI 26.04 kg/m   General: WF in NAD HEENT: normocephalic, anicteric Neck: no thyroid enlargement, no palpable nodules, no cervical lymphadenopathy  Pulmonary: No increased work of breathing, CTAB Cardiovascular: RRR, without murmur  Breast: Breast symmetrical, no tenderness, no  palpable nodules or masses, no skin or nipple retraction present, no nipple discharge.  No axillary, infraclavicular or supraclavicular lymphadenopathy. Abdomen: Soft, non-tender, non-distended.  Umbilicus without lesions.  No hepatomegaly or masses palpable. No evidence of hernia. Genitourinary:  External: Normal external female genitalia.  Normal urethral meatus, normal Bartholin's and Skene's glands.    Vagina: Normal vaginal mucosa, no evidence of prolapse.    Cervix: Deviated to the left, no bleeding, non-tender  Uterus: Retroverted, normal size, shape, and consistency, mobile, and non-tender  Adnexa: No adnexal masses, non-tender  Rectal: deferred  Lymphatic: no evidence of inguinal lymphadenopathy Extremities: no edema, erythema, or tenderness Neurologic: Grossly intact. +tremors noted Psychiatric: mood appropriate, affect full     Assessment: 58 y.o. [id number] annual gyn exam Family history of breast cancer/ Increased risk of breast cancer Desires flu vaccine  Plan:   1) Breast cancer screening - recommend monthly self breast exam. Mammogram was ordered today. Have offered genetic testing and increased surveilance multiple times in the past due to increased lifetime risk of breast cancer and patient has declined  2) Flu vaccine today  3) Cervical cancer screening - Pap was not done. Advised of [employer] new policy to not cover Pap smears done  more frequently than every 3 years. Next due in 2 years.Marland Kitchen.   4) Routine healthcare maintenance including cholesterol and diabetes screening managed by PCP . Flu vaccine given today.  5) Colon cancer screening: colonoscopy UTD  6) RTO 1 year and prn   Farrel Connersolleen Jordanny Waddington, CNM

## 2019-08-13 NOTE — Addendum Note (Signed)
Addended by: Cleophas Dunker D on: 08/13/2019 11:48 AM   Modules accepted: Orders

## 2019-10-16 ENCOUNTER — Other Ambulatory Visit: Payer: Self-pay | Admitting: Physician Assistant

## 2019-10-20 ENCOUNTER — Other Ambulatory Visit: Payer: Self-pay | Admitting: Physician Assistant

## 2019-10-21 NOTE — Telephone Encounter (Signed)
Apt 12/28 

## 2019-10-27 ENCOUNTER — Ambulatory Visit (INDEPENDENT_AMBULATORY_CARE_PROVIDER_SITE_OTHER): Payer: BC Managed Care – PPO | Admitting: Physician Assistant

## 2019-10-27 ENCOUNTER — Encounter: Payer: Self-pay | Admitting: Physician Assistant

## 2019-10-27 ENCOUNTER — Other Ambulatory Visit: Payer: Self-pay

## 2019-10-27 DIAGNOSIS — F411 Generalized anxiety disorder: Secondary | ICD-10-CM

## 2019-10-27 DIAGNOSIS — F422 Mixed obsessional thoughts and acts: Secondary | ICD-10-CM

## 2019-10-27 MED ORDER — CLOMIPRAMINE HCL 50 MG PO CAPS: 50.0000 mg | ORAL_CAPSULE | Freq: Every day | ORAL | 5 refills | Status: DC

## 2019-10-27 MED ORDER — ALPRAZOLAM 0.25 MG PO TABS: ORAL_TABLET | ORAL | 5 refills | Status: DC

## 2019-10-27 MED ORDER — FLUVOXAMINE MALEATE 100 MG PO TABS: ORAL_TABLET | ORAL | 5 refills | Status: DC

## 2019-10-27 NOTE — Progress Notes (Signed)
[facility]  Patient ID: Natasha Kennedy,  MRN: [id number]  PCP: Guadalupe Maple, MD  Date of Evaluation: 10/27/2019 Time spent:15 minutes  Chief Complaint:  Chief Complaint    Anxiety; Depression; Follow-up      HISTORY/CURRENT STATUS: HPI for routine med check.  Patient states she is doing very well under the circumstances of the coronavirus pandemic.  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  "I am glad I am not having to deal with all the stressors."  Her job is going well.  She is able to enjoy things.  OCD symptoms are controlled.  Energy and motivation are good.  She sleeps well.  Does not cry easily.  Denies suicidal or homicidal thoughts.  Anxiety is well controlled.  BuSpar has helped to prevent it and the Xanax helps to treat when needed.  Denies dizziness, syncope, seizures, numbness, tingling, tremor, tics, unsteady gait, slurred speech, confusion. Denies muscle or joint pain, stiffness, or dystonia.  Individual Medical History/ Review of Systems: Changes? :No    Past medications for mental health diagnoses include: Luvox, BuSpar, Xanax, Seroquel, Abilify caused nausea and vomiting, Effexor, clomipramine  Allergies: Patient has no known allergies.  Current Medications:  Current Outpatient Medications:  .  ALPRAZolam (XANAX) 0.25 MG tablet, TAKE 1 TABLET(0.25 MG) BY MOUTH THREE TIMES DAILY AS NEEDED, Disp: 90 tablet, Rfl: 5 .  busPIRone (BUSPAR) 30 MG tablet, TAKE 1 TABLET(30 MG) BY MOUTH TWICE DAILY FOR ANXIETY, Disp: 60 tablet, Rfl: 5 .  clomiPRAMINE (ANAFRANIL) 50 MG capsule, Take 1 capsule (50 mg total) by mouth at bedtime., Disp: 30 capsule, Rfl: 5 .  fluvoxaMINE (LUVOX) 100 MG tablet, TAKE 2 TABLETS(200 MG) BY MOUTH TWICE DAILY, Disp: 120 tablet, Rfl: 5 Medication Side Effects: none  Family Medical/ Social History: Changes? No  MENTAL HEALTH EXAM:  There were no vitals taken for this visit.There is no height or weight on  file to calculate BMI.  General Appearance: Casual, Neat and Well Groomed  Eye Contact:  Good  Speech:  Clear and Coherent  Volume:  Normal  Mood:  Euthymic  Affect:  Appropriate  Thought Process:  Goal Directed and Descriptions of Associations: Intact  Orientation:  Full (Time, Place, and Person)  Thought Content: Logical   Suicidal Thoughts:  No  Homicidal Thoughts:  No  Memory:  WNL  Judgement:  Good  Insight:  Good  Psychomotor Activity:  Normal  Concentration:  Concentration: Good  Recall:  Good  Fund of Knowledge: Good  Language: Good  Assets:  Desire for Improvement  ADL's:  Intact  Cognition: WNL  Prognosis:  Good    DIAGNOSES:    ICD-10-CM   1. Mixed obsessional thoughts and acts  F42.2   2. Generalized anxiety disorder  F41.1     Receiving Psychotherapy: No    RECOMMENDATIONS:  Continue Luvox 100 mg, 2 p.o. twice daily. Continue clomipramine 50 mg nightly. Continue BuSpar 30 mg 1 p.o. twice daily. Continue Xanax 0.25 mg, 1 p.o. 3 times daily as needed. Return in 6 months.  Donnal Moat, PA-C

## 2019-11-25 ENCOUNTER — Ambulatory Visit
Admission: RE | Admit: 2019-11-25 | Discharge: 2019-11-25 | Disposition: A | Discharge status: Home / Self Care | Payer: BC Managed Care – PPO | Source: Ambulatory Visit | Attending: Certified Nurse Midwife | Admitting: Certified Nurse Midwife

## 2019-11-25 DIAGNOSIS — Z803 Family history of malignant neoplasm of breast: Secondary | ICD-10-CM | POA: Diagnosis present

## 2019-11-25 DIAGNOSIS — Z1231 Encounter for screening mammogram for malignant neoplasm of breast: Secondary | ICD-10-CM | POA: Insufficient documentation

## 2019-11-25 DIAGNOSIS — Z9189 Other specified personal risk factors, not elsewhere classified: Secondary | ICD-10-CM | POA: Diagnosis present

## 2020-01-10 ENCOUNTER — Other Ambulatory Visit: Payer: Self-pay

## 2020-01-10 ENCOUNTER — Ambulatory Visit: Payer: BC Managed Care – PPO | Attending: Internal Medicine

## 2020-01-10 DIAGNOSIS — Z23 Encounter for immunization: Secondary | ICD-10-CM

## 2020-01-10 NOTE — Progress Notes (Signed)
   Covid-[facility] Vaccination [facility]  Name:  Natasha Kennedy    MRN: [id number] DOB: February 07, 1961  01/10/2020  [name] was observed post Covid-19 immunization for 15 minutes without incident. She was provided with Vaccine Information Sheet and instruction to access the V-Safe system.   [name] was instructed to call 911 with any severe reactions post vaccine: Marland Kitchen Difficulty breathing  . Swelling of face and throat  . A fast heartbeat  . A bad rash all over body  . Dizziness and weakness   Immunizations Administered    Name Date Dose VIS Date Route   Pfizer COVID-19 Vaccine 01/10/2020  8:43 AM 0.3 mL 10/10/2019 Intramuscular   Manufacturer: [employer], [employer]   Lot: [id number]   NDC: [address]-1

## 2020-02-04 ENCOUNTER — Ambulatory Visit: Payer: BC Managed Care – PPO | Attending: Internal Medicine

## 2020-02-04 DIAGNOSIS — Z23 Encounter for immunization: Secondary | ICD-10-CM

## 2020-02-04 NOTE — Progress Notes (Signed)
   Covid-19 Vaccination [facility]  Name:  Natasha Kennedy    MRN: [id number] DOB: 1960/12/08  02/04/2020  [name] was observed post Covid-19 immunization for 15 minutes without incident. She was provided with Vaccine Information Sheet and instruction to access the V-Safe system.   [name] was instructed to call 911 with any severe reactions post vaccine: Marland Kitchen Difficulty breathing  . Swelling of face and throat  . A fast heartbeat  . A bad rash all over body  . Dizziness and weakness   Immunizations Administered    Name Date Dose VIS Date Route   Pfizer COVID-19 Vaccine 02/04/2020  4:10 PM 0.3 mL 10/10/2019 Intramuscular   Manufacturer: [employer], [employer]   Lot: [id number]   NDC: [address]-4

## 2020-04-16 ENCOUNTER — Other Ambulatory Visit: Payer: Self-pay | Admitting: Physician Assistant

## 2020-04-26 ENCOUNTER — Other Ambulatory Visit: Payer: Self-pay

## 2020-04-26 ENCOUNTER — Ambulatory Visit (INDEPENDENT_AMBULATORY_CARE_PROVIDER_SITE_OTHER): Payer: BC Managed Care – PPO | Admitting: Physician Assistant

## 2020-04-26 ENCOUNTER — Encounter: Payer: Self-pay | Admitting: Physician Assistant

## 2020-04-26 DIAGNOSIS — F411 Generalized anxiety disorder: Secondary | ICD-10-CM

## 2020-04-26 DIAGNOSIS — F331 Major depressive disorder, recurrent, moderate: Secondary | ICD-10-CM

## 2020-04-26 DIAGNOSIS — F422 Mixed obsessional thoughts and acts: Secondary | ICD-10-CM

## 2020-04-26 MED ORDER — FLUVOXAMINE MALEATE 100 MG PO TABS: ORAL_TABLET | ORAL | 5 refills | Status: DC

## 2020-04-26 MED ORDER — CLOMIPRAMINE HCL 50 MG PO CAPS: 50.0000 mg | ORAL_CAPSULE | Freq: Every day | ORAL | 5 refills | Status: DC

## 2020-04-26 MED ORDER — BUSPIRONE HCL 30 MG PO TABS: ORAL_TABLET | ORAL | 5 refills | Status: DC

## 2020-04-26 MED ORDER — ALPRAZOLAM 0.25 MG PO TABS: ORAL_TABLET | ORAL | 5 refills | Status: DC

## 2020-04-26 NOTE — Progress Notes (Signed)
[facility]  Patient ID: Natasha Kennedy,  MRN: [id number]  PCP: Steele Sizer, MD  Date of Evaluation: 04/26/2020 Time spent:20 minutes  Chief Complaint:  Chief Complaint    Anxiety; Depression      HISTORY/CURRENT STATUS: HPI for routine med check.  Doing really well.  She is able to enjoy things.  OCD symptoms are controlled.  Energy and motivation are good.  She sleeps well.  Does not cry easily.  Denies SI/HI.  Anxiety is well controlled.  BuSpar is really helpful to prevent the obsessive thinking and the Xanax helps when she overthinks things.  She will often take 1 in the morning because most of the time she will have anxieties to pop up and it helps her to cope with the day.  Denies dizziness, syncope, seizures, numbness, tingling, tremor, tics, unsteady gait, slurred speech, confusion. Denies muscle or joint pain, stiffness, or dystonia.  Individual Medical History/ Review of Systems: Changes? :No    Past medications for mental health diagnoses include: Luvox, BuSpar, Xanax, Seroquel, Abilify caused nausea and vomiting, Effexor, clomipramine  Allergies: Patient has no known allergies.  Current Medications:  Current Outpatient Medications:  .  ALPRAZolam (XANAX) 0.25 MG tablet, TAKE 1 TABLET(0.25 MG) BY MOUTH THREE TIMES DAILY AS NEEDED, Disp: 90 tablet, Rfl: 5 .  busPIRone (BUSPAR) 30 MG tablet, TAKE 1 TABLET(30 MG) BY MOUTH TWICE DAILY FOR ANXIETY, Disp: 60 tablet, Rfl: 5 .  clomiPRAMINE (ANAFRANIL) 50 MG capsule, Take 1 capsule (50 mg total) by mouth at bedtime., Disp: 30 capsule, Rfl: 5 .  fluvoxaMINE (LUVOX) 100 MG tablet, TAKE 2 TABLETS(200 MG) BY MOUTH TWICE DAILY, Disp: 120 tablet, Rfl: 5 Medication Side Effects: none  Family Medical/ Social History: Changes? No.  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  She and her family just got back from the beach which was nice.  MENTAL HEALTH EXAM:  Last menstrual period 04/26/2016.There is no height  or weight on file to calculate BMI.  General Appearance: Casual, Neat and Well Groomed  Eye Contact:  Good  Speech:  Clear and Coherent and Normal Rate  Volume:  Normal  Mood:  Euthymic  Affect:  Appropriate  Thought Process:  Goal Directed and Descriptions of Associations: Intact  Orientation:  Full (Time, Place, and Person)  Thought Content: Logical   Suicidal Thoughts:  No  Homicidal Thoughts:  No  Memory:  WNL  Judgement:  Good  Insight:  Good  Psychomotor Activity:  Normal  Concentration:  Concentration: Good and Attention Span: Good  Recall:  Good  Fund of Knowledge: Good  Language: Good  Assets:  Desire for Improvement  ADL's:  Intact  Cognition: WNL  Prognosis:  Good    DIAGNOSES:    ICD-10-CM   1. Mixed obsessional thoughts and acts  F42.2   2. Generalized anxiety disorder  F41.1   3. Major depressive disorder, recurrent episode, moderate (HCC)  F33.1     Receiving Psychotherapy: No    RECOMMENDATIONS:  PDMP was reviewed. I am glad to see her doing so well! Continue Luvox 100 mg, 2 p.o. twice daily. Continue clomipramine 50 mg nightly. Continue BuSpar 30 mg 1 p.o. twice daily. Continue Xanax 0.25 mg, 1 p.o. 3 times daily as needed. Return in 6 months.  [name], PA-C

## 2020-06-14 ENCOUNTER — Other Ambulatory Visit: Payer: Self-pay | Admitting: Physician Assistant

## 2020-10-17 ENCOUNTER — Other Ambulatory Visit: Payer: Self-pay | Admitting: Physician Assistant

## 2020-10-26 ENCOUNTER — Ambulatory Visit (INDEPENDENT_AMBULATORY_CARE_PROVIDER_SITE_OTHER): Payer: BC Managed Care – PPO | Admitting: Physician Assistant

## 2020-10-26 ENCOUNTER — Other Ambulatory Visit: Payer: Self-pay

## 2020-10-26 ENCOUNTER — Encounter: Payer: Self-pay | Admitting: Physician Assistant

## 2020-10-26 DIAGNOSIS — F3341 Major depressive disorder, recurrent, in partial remission: Secondary | ICD-10-CM | POA: Diagnosis not present

## 2020-10-26 DIAGNOSIS — F422 Mixed obsessional thoughts and acts: Secondary | ICD-10-CM

## 2020-10-26 DIAGNOSIS — F411 Generalized anxiety disorder: Secondary | ICD-10-CM

## 2020-10-26 MED ORDER — BUSPIRONE HCL 30 MG PO TABS: ORAL_TABLET | ORAL | 5 refills | Status: DC

## 2020-10-26 MED ORDER — FLUVOXAMINE MALEATE 100 MG PO TABS: ORAL_TABLET | ORAL | 5 refills | Status: DC

## 2020-10-26 MED ORDER — CLOMIPRAMINE HCL 50 MG PO CAPS: ORAL_CAPSULE | ORAL | 5 refills | Status: DC

## 2020-10-26 MED ORDER — ALPRAZOLAM 0.25 MG PO TABS: ORAL_TABLET | ORAL | 5 refills | Status: DC

## 2020-10-26 NOTE — Progress Notes (Signed)
[facility]  Patient ID: Natasha Kennedy,  MRN: [id number]  PCP: Steele Sizer, MD  Date of Evaluation: 10/26/2020 Time spent:20 minutes  Chief Complaint:  Chief Complaint    Anxiety; Depression; Follow-up      HISTORY/CURRENT STATUS: HPI For routine med check.  For the most part, Waniya states she is doing well.  A month or so ago she had a few weeks episode of feeling "down" and was really anxious when she had to go back to work.  An example is on Sunday evening, she was dreading going to work the next day.  That feeling is not as bad as it used to be but she does still have some anxiety over going to work.  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  The Xanax does help.  She does not want to take any more medicine than absolutely has to.  She still has obsessive thoughts that are worse sometimes than the other as noted above.  Compulsions are not a problem at present.  She is able to enjoy things.  Energy and motivation are good.  She does not cry easily.  Appetite and weight are stable.  No isolating.  No suicidal or homicidal thoughts.  Denies dizziness, syncope, seizures, numbness, tingling, tremor, tics, unsteady gait, slurred speech, confusion. Denies muscle or joint pain, stiffness, or dystonia.  Individual Medical History/ Review of Systems: Changes? :No    Past medications for mental health diagnoses include: Luvox, BuSpar, Xanax, Seroquel, Abilify caused nausea and vomiting, Effexor, clomipramine  Allergies: Patient has no known allergies.  Current Medications:  Current Outpatient Medications:    ALPRAZolam (XANAX) 0.25 MG tablet, TAKE 1 TABLET(0.25 MG) BY MOUTH THREE TIMES DAILY AS NEEDED, Disp: 90 tablet, Rfl: 5   busPIRone (BUSPAR) 30 MG tablet, TAKE 1 TABLET(30 MG) BY MOUTH TWICE DAILY FOR ANXIETY, Disp: 60 tablet, Rfl: 5   clomiPRAMINE  (ANAFRANIL) 50 MG capsule, TAKE 1 CAPSULE(50 MG) BY MOUTH AT BEDTIME, Disp: 30 capsule, Rfl: 5   fluvoxaMINE (LUVOX) 100 MG tablet, 2 po bid, Disp: 120 tablet, Rfl: 5 Medication Side Effects: none  Family Medical/ Social History: Changes? No  MENTAL HEALTH EXAM:  Last menstrual period 04/26/2016.There is no height or weight on file to calculate BMI.  General Appearance: Casual, Neat and Well Groomed  Eye Contact:  Good  Speech:  Clear and Coherent and Normal Rate  Volume:  Normal  Mood:  Euthymic  Affect:  Congruent  Thought Process:  Goal Directed and Descriptions of Associations: Intact  Orientation:  Full (Time, Place, and Person)  Thought Content: Logical   Suicidal Thoughts:  No  Homicidal Thoughts:  No  Memory:  WNL  Judgement:  Good  Insight:  Good  Psychomotor Activity:  Normal  Concentration:  Concentration: Good  Recall:  Good  Fund of Knowledge: Good  Language: Good  Assets:  Desire for Improvement  ADL's:  Intact  Cognition: WNL  Prognosis:  Good    DIAGNOSES:    ICD-10-CM   1. Generalized anxiety disorder  F41.1   2. Mixed obsessional thoughts and acts  F42.2   3. Recurrent major depressive disorder, in partial remission (HCC)  F33.41     Receiving Psychotherapy: No    RECOMMENDATIONS:  PDMP was reviewed. I provided 20 minutes of face-to-face time during this encounter,  in which we discussed the anxiety and sense of dread.  Counseling is recommended, as she is already on very high dose of Luvox, and also on clomipramine.  She does not want more medication or to increase the Xanax dose.  She is open to counseling. Continue Xanax 0.25 mg, 1 p.o. 3 times daily (she usually takes 1 p.o. every morning and 2 p.o. nightly.) Continue BuSpar 30 mg, 1 p.o. twice daily. Continue clomipramine 50 mg, 1 p.o. nightly. Continue Luvox 100 mg, 2 p.o. twice daily. She was given the names of [facility] who is in [place] which is closer for her to drive.   I also recommended [facility]. Return in 6 months.  [name], PA-C

## 2021-04-15 ENCOUNTER — Telehealth: Payer: Self-pay | Admitting: Physician Assistant

## 2021-04-15 ENCOUNTER — Other Ambulatory Visit: Payer: Self-pay

## 2021-04-15 NOTE — Telephone Encounter (Signed)
Pended.

## 2021-04-15 NOTE — Telephone Encounter (Signed)
Next visit is 04/26/21. Requesting refill on Alprazolam called to [facility], [address] [place], [place]. Phone # is [phone].

## 2021-04-18 MED ORDER — ALPRAZOLAM 0.25 MG PO TABS: ORAL_TABLET | ORAL | 0 refills | Status: DC

## 2021-04-26 ENCOUNTER — Encounter: Payer: Self-pay | Admitting: Physician Assistant

## 2021-04-26 ENCOUNTER — Ambulatory Visit: Payer: BC Managed Care – PPO | Admitting: Physician Assistant

## 2021-04-26 ENCOUNTER — Other Ambulatory Visit: Payer: Self-pay

## 2021-04-26 ENCOUNTER — Ambulatory Visit: Payer: BC Managed Care – PPO | Admitting: Advanced Practice Midwife

## 2021-04-26 DIAGNOSIS — F411 Generalized anxiety disorder: Secondary | ICD-10-CM

## 2021-04-26 DIAGNOSIS — F3341 Major depressive disorder, recurrent, in partial remission: Secondary | ICD-10-CM

## 2021-04-26 DIAGNOSIS — F422 Mixed obsessional thoughts and acts: Secondary | ICD-10-CM | POA: Diagnosis not present

## 2021-04-26 MED ORDER — FLUVOXAMINE MALEATE 100 MG PO TABS: ORAL_TABLET | ORAL | 5 refills | Status: DC

## 2021-04-26 MED ORDER — ALPRAZOLAM 0.25 MG PO TABS: ORAL_TABLET | ORAL | 5 refills | Status: DC

## 2021-04-26 MED ORDER — CLOMIPRAMINE HCL 50 MG PO CAPS: ORAL_CAPSULE | ORAL | 5 refills | Status: DC

## 2021-04-26 MED ORDER — BUSPIRONE HCL 30 MG PO TABS: ORAL_TABLET | ORAL | 5 refills | Status: DC

## 2021-04-26 NOTE — Progress Notes (Signed)
[facility]  Patient ID: Natasha Kennedy,  MRN: [id number]  PCP: Loura Pardon, MD  Date of Evaluation: 04/26/2021 Time spent:30 minutes  Chief Complaint:  Chief Complaint   Anxiety; Depression; Insomnia; Follow-up      HISTORY/CURRENT STATUS: HPI For routine med check.  Doing well.  OCD is well controlled.  She had a week or so in the late spring where she had more obsessive thoughts but it went away pretty fast.  No known trigger.  She has not changed her medication.  She still takes the Luvox 200 mg in the morning and 200 mg in the evening.  States it does not cause her to be drowsy on workdays.  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  On days when she is not teaching, she does feel drowsy and takes a nap.  That is a treat for her.  She is able to enjoy things.  She and her family went to the beach a few weeks ago and enjoyed that.  She has been reading for fun this summer.  She does not have any other hobbies though.  States she stays on the couch a lot.  Even when she is working she does not want to do anything after work or on Saturdays.  She goes to church on Sunday and anytime that she has her kids over or she goes to any of their homes for dinner she feels a little anxious.  She does not cancel plans though.  Energy and motivation are good when she is working.  Does not call out of work.  This isolation that she described is not new.  It has been like this for a long time.  Does not cry easily.  She does not want to increase her meds.  Denies suicidal or homicidal thoughts.  Patient denies increased energy with decreased need for sleep, no increased talkativeness, no racing thoughts, no impulsivity or risky behaviors, no increased spending, no increased libido, no grandiosity, no increased irritability or anger, and no hallucinations.  Denies dizziness, syncope, seizures, numbness, tingling, tremor, tics, unsteady  gait, slurred speech, confusion. Denies muscle or joint pain, stiffness, or dystonia.  Individual Medical History/ Review of Systems: Changes? :No    Past medications for mental health diagnoses include: Luvox, BuSpar, Xanax, Seroquel, Abilify caused nausea and vomiting, Effexor, clomipramine  Allergies: Patient has no known allergies.  Current Medications:  Current Outpatient Medications:    ALPRAZolam (XANAX) 0.25 MG tablet, TAKE 1 TABLET(0.25 MG) BY MOUTH THREE TIMES DAILY AS NEEDED, Disp: 90 tablet, Rfl: 5   busPIRone (BUSPAR) 30 MG tablet, TAKE 1 TABLET(30 MG) BY MOUTH TWICE DAILY FOR ANXIETY, Disp: 60 tablet, Rfl: 5   clomiPRAMINE (ANAFRANIL) 50 MG capsule, TAKE 1 CAPSULE(50 MG) BY MOUTH AT BEDTIME, Disp: 30 capsule, Rfl: 5   fluvoxaMINE (LUVOX) 100 MG tablet, 2 po bid, Disp: 120 tablet, Rfl: 5 Medication Side Effects: none  Family Medical/ Social History: Changes? No  MENTAL HEALTH EXAM:  Last menstrual period 04/26/2016.There is no height or weight on file to calculate BMI.  General Appearance: Casual, Neat and Well Groomed  Eye Contact:  Good  Speech:  Clear and Coherent and Normal Rate  Volume:  Normal  Mood:  Euthymic  Affect:  Congruent  Thought Process:  Goal Directed and Descriptions of Associations: Intact  Orientation:  Full (Time, Place, and Person)  Thought Content: Logical   Suicidal  Thoughts:  No  Homicidal Thoughts:  No  Memory:  WNL  Judgement:  Good  Insight:  Good  Psychomotor Activity:  Normal  Concentration:  Concentration: Good  Recall:  Good  Fund of Knowledge: Good  Language: Good  Assets:  Desire for Improvement  ADL's:  Intact  Cognition: WNL  Prognosis:  Good    DIAGNOSES:    ICD-10-CM   1. Mixed obsessional thoughts and acts  F42.2     2. Recurrent major depressive disorder, in partial remission (HCC)  F33.41     3. Generalized anxiety disorder  F41.1        Receiving Psychotherapy: No    RECOMMENDATIONS:  PDMP was  reviewed.  Last Xanax filled 04/15/2021. I provided 30 minutes of face to face time during this encounter, including time spent before and after the visit in records review, medical decision making, and charting.  We discussed the isolating behaviors.  It is difficult to know whether it is significant or not.  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  Also her age can make a difference, just more tired than she used to be in the evenings.  If however she gets to a point where she cancels plans, calls out of work, or stops going to church, she should let me know.  That could be a sign of depression.  She understands. I would also recommend the Luvox be taken at at bedtime.  The dose that she takes in the mornings might make her really tired during the day so that she is unable to have any desire to do much of anything on the days when she is off. For now no changes will be made. Continue Xanax 0.25 mg, 1 p.o. 3 times daily (she usually takes 1 p.o. every morning and 2 p.o. nightly.) Continue BuSpar 30 mg, 1 p.o. twice daily. Continue clomipramine 50 mg, 1 p.o. nightly. Continue Luvox 100 mg, 4 p.o. nightly. Return in 6 months.  [name], PA-C

## 2021-04-28 ENCOUNTER — Encounter: Payer: Self-pay | Admitting: Advanced Practice Midwife

## 2021-04-28 ENCOUNTER — Ambulatory Visit (INDEPENDENT_AMBULATORY_CARE_PROVIDER_SITE_OTHER): Payer: BC Managed Care – PPO | Admitting: Advanced Practice Midwife

## 2021-04-28 ENCOUNTER — Other Ambulatory Visit: Payer: Self-pay

## 2021-04-28 VITALS — BP 120/70 | Ht 63.0 in | Wt 127.0 lb

## 2021-04-28 DIAGNOSIS — Z1239 Encounter for other screening for malignant neoplasm of breast: Secondary | ICD-10-CM

## 2021-04-28 DIAGNOSIS — Z0000 Encounter for general adult medical examination without abnormal findings: Secondary | ICD-10-CM | POA: Diagnosis not present

## 2021-04-28 NOTE — Progress Notes (Signed)
Gynecology Annual Exam  PCP: Loura Pardon, MD  Chief Complaint:  Chief Complaint  Patient presents with   Gynecologic Exam    No concerns    History of Present Illness:Patient is a 60 y.o. [id number] presents for annual exam. The patient has no complaints today.   LMP: Patient's last menstrual period was 04/26/2016 (approximate).  Postcoital Bleeding: no Dysmenorrhea: not applicable  The patient is sexually active. She has dyspareunia and uses a lubricant. She declines HRT.  The patient does not perform self breast exams.  There is notable family history of breast or ovarian cancer in her family. She has 3 maternal relatives with breast cancer all diagnosed at greater then 50 years. She declines genetic testing.   The patient wears seatbelts: yes.   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  She admits needing to increase fruit and vegetable intake. She admits adequate hydration and sleep.  The patient denies current symptoms of depression.  She has anxiety which is controlled with medication.  Review of Systems: Review of Systems  Constitutional:  Negative for chills and fever.  HENT:  Negative for congestion, ear discharge, ear pain, hearing loss, sinus pain and sore throat.   Eyes:  Negative for blurred vision and double vision.  Respiratory:  Negative for cough, shortness of breath and wheezing.   Cardiovascular:  Negative for chest pain, palpitations and leg swelling.  Gastrointestinal:  Negative for abdominal pain, blood in stool, constipation, diarrhea, heartburn, melena, nausea and vomiting.  Genitourinary:  Negative for dysuria, flank pain, frequency, hematuria and urgency.  Musculoskeletal:  Negative for back pain, joint pain and myalgias.  Skin:  Negative for itching and rash.  Neurological:  Negative for dizziness, tingling, tremors, sensory change, speech change, focal weakness, seizures, loss of consciousness, weakness and  headaches.  Endo/Heme/Allergies:  Negative for environmental allergies. Does not bruise/bleed easily.  Psychiatric/Behavioral:  Negative for depression, hallucinations, memory loss, substance abuse and suicidal ideas. The patient is not nervous/anxious and does not have insomnia.        Positive for anxiety   Past Medical History:  Patient Active Problem List   Diagnosis Date Noted   Family history of breast cancer 08/13/2019   Increased risk of breast cancer 08/13/2019    42.5% lifetime risk of breast cancer as of 2020.      GAD (generalized anxiety disorder) 10/10/2018   Memory loss 04/11/2018   Dermatitis of external ear 04/11/2018   MDD (major depressive disorder) 06/28/2015   OCD (obsessive compulsive disorder) 06/28/2015   Major depressive disorder, recurrent, severe without psychotic features [facility])     Past Surgical History:  Past Surgical History:  Procedure Laterality Date   CHOLECYSTECTOMY  2008   COLONOSCOPY  01/2014   negative   ECTOPIC PREGNANCY SURGERY  1994   RHINOPLASTY  1978   WISDOM TOOTH EXTRACTION      Gynecologic History:  Patient's last menstrual period was 04/26/2016 (approximate). Last Pap: 3 years ago Results were:  no abnormalities  Last mammogram: 1 year ago Results were: BI-RAD I  Obstetric History: [id number]  Family History:  Family History  Problem Relation Age of Onset   Breast cancer Mother 60   Heart disease Mother        pacemaker   Hypertension Mother    Hip fracture Mother    Heart disease Father        heart attack/ cardiac arrest-2013   Hyperlipidemia Father  Hypertension Father    Hypertension Sister    Breast cancer Sister 15   Hypertension Brother    Breast cancer Paternal Aunt 74   Breast cancer Maternal Grandmother 72   Heart disease Paternal Grandmother    Breast cancer Cousin 31    Social History:  Social History   Socioeconomic History   Marital status: Married    Spouse name: Not on file   Number of  children: 2   Years of education: Not on file   Highest education level: Not on file  Occupational History   Occupation: first grade teacher  Tobacco Use   Smoking status: Never   Smokeless tobacco: Never  Vaping Use   Vaping Use: Never used  Substance and Sexual Activity   Alcohol use: No    Comment: very limited   Drug use: No    Comment: Patient denies    Sexual activity: Yes    Birth control/protection: None  Other Topics Concern   Not on file  Social History Narrative   Not on file   Social Determinants of Health   Financial Resource Strain: Not on file  Food Insecurity: Not on file  Transportation Needs: Not on file  Physical Activity: Not on file  Stress: Not on file  Social Connections: Not on file  Intimate Partner Violence: Not on file    Allergies:  No Known Allergies  Medications: Prior to Admission medications   Medication Sig Start Date End Date Taking? Authorizing Provider  ALPRAZolam (XANAX) 0.25 MG tablet TAKE 1 TABLET(0.25 MG) BY MOUTH THREE TIMES DAILY AS NEEDED 04/26/21  Yes Hurst, Teresa T, PA-C  busPIRone (BUSPAR) 30 MG tablet TAKE 1 TABLET(30 MG) BY MOUTH TWICE DAILY FOR ANXIETY 04/26/21  Yes Hurst, Teresa T, PA-C  clomiPRAMINE (ANAFRANIL) 50 MG capsule TAKE 1 CAPSULE(50 MG) BY MOUTH AT BEDTIME 04/26/21  Yes Hurst, Teresa T, PA-C  fluvoxaMINE (LUVOX) 100 MG tablet 2 po bid 04/26/21  Yes Cherie Ouch, PA-C    Physical Exam Vitals: Blood pressure 120/70, height 5\' 3"  (1.6 m), weight 127 lb (57.6 kg), last menstrual period 04/26/2016.  General: NAD HEENT: normocephalic, anicteric Thyroid: no enlargement, no palpable nodules Pulmonary: No increased work of breathing, CTAB Cardiovascular: RRR, distal pulses 2+ Breast: Breast symmetrical, no tenderness, no palpable nodules or masses, no skin or nipple retraction present, no nipple discharge.  No axillary or supraclavicular lymphadenopathy. Abdomen: NABS, soft, non-tender, non-distended.  Umbilicus  without lesions.  No hepatomegaly, splenomegaly or masses palpable. No evidence of hernia  Genitourinary:  External: Normal external female genitalia.  Normal urethral meatus, normal Bartholin's and Skene's glands.    Vagina: Normal vaginal mucosa, no evidence of prolapse.    Cervix: Grossly normal in appearance, no bleeding  Uterus: Non-enlarged, mobile, normal contour.  No CMT  Adnexa: ovaries non-enlarged, no adnexal masses  Rectal: deferred  Lymphatic: no evidence of inguinal lymphadenopathy Extremities: no edema, erythema, or tenderness Neurologic: Grossly intact Psychiatric: mood appropriate, affect full  Female chaperone present for pelvic and breast  portions of the physical exam     Assessment: 60 y.o. 60 routine annual exam  Plan: Problem List Items Addressed This Visit   None Visit Diagnoses     Well woman exam without gynecological exam    -  Primary   Relevant Orders   MM DIGITAL SCREENING BILATERAL   Breast screening       Relevant Orders   MM DIGITAL SCREENING BILATERAL  1) Mammogram - recommend yearly screening mammogram.  Mammogram Was ordered today  2) STI screening  was offered and declined  3) ASCCP guidelines and rationale discussed.  Patient opts for every 5 years screening interval  4) Osteoporosis  - per USPTF routine screening DEXA at age 41  Consider FDA-approved medical therapies in postmenopausal women and men aged 17 years and older, based on the following: a) A hip or vertebral (clinical or morphometric) fracture b) T-score ? -2.5 at the femoral neck or spine after appropriate evaluation to exclude secondary causes C) Low bone mass (T-score between -1.0 and -2.5 at the femoral neck or spine) and a 10-year probability of a hip fracture ? 3% or a 10-year probability of a major osteoporosis-related fracture ? 20% based on the [place]-adapted WHO algorithm   5) Routine healthcare maintenance including cholesterol, diabetes screening  discussed Declines  6) Colonoscopy is up to date and due in 3 years.  Screening recommended starting at age 46 for average risk individuals, age 79 for individuals deemed at increased risk (including African Americans) and recommended to continue until age 53.  For patient age 26-85 individualized approach is recommended.  Gold standard screening is via colonoscopy, Cologuard screening is an acceptable alternative for patient unwilling or unable to undergo colonoscopy.  "Colorectal cancer screening for average?risk adults: 2018 guideline update from the American Cancer Society"CA: A Cancer Journal for Clinicians: Mar 28, 2017   7) Return in about 1 year (around 04/28/2022) for annual established gyn.    Parke Poisson, CNM [facility] 04/28/21. 4:43 PM

## 2021-04-28 NOTE — Patient Instructions (Signed)
Health Maintenance, Female Adopting a healthy lifestyle and getting preventive care are important in promoting health and wellness. Ask your health care provider about: The right schedule for you to have regular tests and exams. Things you can do on your own to prevent diseases and keep yourself healthy. What should I know about diet, weight, and exercise? Eat a healthy diet  Eat a diet that includes plenty of vegetables, fruits, low-fat dairy products, and lean protein. Do not eat a lot of foods that are high in solid fats, added sugars, or sodium.  Maintain a healthy weight Body mass index (BMI) is used to identify weight problems. It estimates body fat based on height and weight. Your health care provider can help determineyour BMI and help you achieve or maintain a healthy weight. Get regular exercise Get regular exercise. This is one of the most important things you can do for your health. Most adults should: Exercise for at least 150 minutes each week. The exercise should increase your heart rate and make you sweat (moderate-intensity exercise). Do strengthening exercises at least twice a week. This is in addition to the moderate-intensity exercise. Spend less time sitting. Even light physical activity can be beneficial. Watch cholesterol and blood lipids Have your blood tested for lipids and cholesterol at 58 years of age, then havethis test every 5 years. Have your cholesterol levels checked more often if: Your lipid or cholesterol levels are high. You are older than 58 years of age. You are at high risk for heart disease. What should I know about cancer screening? Depending on your health history and family history, you may need to have cancer screening at various ages. This may include screening for: Breast cancer. Cervical cancer. Colorectal cancer. Skin cancer. Lung cancer. What should I know about heart disease, diabetes, and high blood pressure? Blood pressure and heart  disease High blood pressure causes heart disease and increases the risk of stroke. This is more likely to develop in people who have high blood pressure readings, are of African descent, or are overweight. Have your blood pressure checked: Every 3-5 years if you are 18-39 years of age. Every year if you are 40 years old or older. Diabetes Have regular diabetes screenings. This checks your fasting blood sugar level. Have the screening done: Once every three years after age 40 if you are at a normal weight and have a low risk for diabetes. More often and at a younger age if you are overweight or have a high risk for diabetes. What should I know about preventing infection? Hepatitis B If you have a higher risk for hepatitis B, you should be screened for this virus. Talk with your health care provider to find out if you are at risk forhepatitis B infection. Hepatitis C Testing is recommended for: Everyone born from 1945 through 1965. Anyone with known risk factors for hepatitis C. Sexually transmitted infections (STIs) Get screened for STIs, including gonorrhea and chlamydia, if: You are sexually active and are younger than 58 years of age. You are older than 58 years of age and your health care provider tells you that you are at risk for this type of infection. Your sexual activity has changed since you were last screened, and you are at increased risk for chlamydia or gonorrhea. Ask your health care provider if you are at risk. Ask your health care provider about whether you are at high risk for HIV. Your health care provider may recommend a prescription medicine to help   prevent HIV infection. If you choose to take medicine to prevent HIV, you should first get tested for HIV. You should then be tested every 3 months for as long as you are taking the medicine. Pregnancy If you are about to stop having your period (premenopausal) and you may become pregnant, seek counseling before you get  pregnant. Take 400 to 800 micrograms (mcg) of folic acid every day if you become pregnant. Ask for birth control (contraception) if you want to prevent pregnancy. Osteoporosis and menopause Osteoporosis is a disease in which the bones lose minerals and strength with aging. This can result in bone fractures. If you are 65 years old or older, or if you are at risk for osteoporosis and fractures, ask your health care provider if you should: Be screened for bone loss. Take a calcium or vitamin D supplement to lower your risk of fractures. Be given hormone replacement therapy (HRT) to treat symptoms of menopause. Follow these instructions at home: Lifestyle Do not use any products that contain nicotine or tobacco, such as cigarettes, e-cigarettes, and chewing tobacco. If you need help quitting, ask your health care provider. Do not use street drugs. Do not share needles. Ask your health care provider for help if you need support or information about quitting drugs. Alcohol use Do not drink alcohol if: Your health care provider tells you not to drink. You are pregnant, may be pregnant, or are planning to become pregnant. If you drink alcohol: Limit how much you use to 0-1 drink a day. Limit intake if you are breastfeeding. Be aware of how much alcohol is in your drink. In the [place] one drink equals one 12 oz bottle of beer (355 mL), one 5 oz glass of wine (148 mL), or one 1 oz glass of hard liquor (44 mL). General instructions Schedule regular health, dental, and eye exams. Stay current with your vaccines. Tell your health care provider if: You often feel depressed. You have ever been abused or do not feel safe at home. Summary Adopting a healthy lifestyle and getting preventive care are important in promoting health and wellness. Follow your health care provider's instructions about healthy diet, exercising, and getting tested or screened for diseases. Follow your health care provider's  instructions on monitoring your cholesterol and blood pressure. This information is not intended to replace advice given to you by your health care provider. Make sure you discuss any questions you have with your healthcare provider. Document Revised: 10/09/2018 Document Reviewed: 10/09/2018 Elsevier Patient Education  2022 Elsevier Inc.  

## 2021-05-10 ENCOUNTER — Other Ambulatory Visit: Payer: Self-pay | Admitting: Advanced Practice Midwife

## 2021-05-10 DIAGNOSIS — Z1231 Encounter for screening mammogram for malignant neoplasm of breast: Secondary | ICD-10-CM

## 2021-06-28 ENCOUNTER — Ambulatory Visit
Admission: RE | Admit: 2021-06-28 | Discharge: 2021-06-28 | Disposition: A | Discharge status: Home / Self Care | Payer: BC Managed Care – PPO | Source: Ambulatory Visit | Attending: Advanced Practice Midwife | Admitting: Advanced Practice Midwife

## 2021-06-28 ENCOUNTER — Other Ambulatory Visit: Payer: Self-pay

## 2021-06-28 DIAGNOSIS — Z1231 Encounter for screening mammogram for malignant neoplasm of breast: Secondary | ICD-10-CM | POA: Insufficient documentation

## 2021-10-10 ENCOUNTER — Other Ambulatory Visit: Payer: Self-pay | Admitting: Physician Assistant

## 2021-10-27 ENCOUNTER — Encounter: Payer: Self-pay | Admitting: Physician Assistant

## 2021-10-27 ENCOUNTER — Telehealth: Payer: BC Managed Care – PPO | Admitting: Physician Assistant

## 2021-10-27 DIAGNOSIS — F422 Mixed obsessional thoughts and acts: Secondary | ICD-10-CM | POA: Diagnosis not present

## 2021-10-27 DIAGNOSIS — F411 Generalized anxiety disorder: Secondary | ICD-10-CM

## 2021-10-27 DIAGNOSIS — F3341 Major depressive disorder, recurrent, in partial remission: Secondary | ICD-10-CM | POA: Diagnosis not present

## 2021-10-27 MED ORDER — ALPRAZOLAM 0.25 MG PO TABS: ORAL_TABLET | ORAL | 5 refills | Status: DC

## 2021-10-27 MED ORDER — BUSPIRONE HCL 30 MG PO TABS: ORAL_TABLET | ORAL | 5 refills | Status: DC

## 2021-10-27 MED ORDER — FLUVOXAMINE MALEATE 100 MG PO TABS: ORAL_TABLET | ORAL | 5 refills | Status: DC

## 2021-10-27 NOTE — Progress Notes (Signed)
[facility]  Patient ID: Natasha Kennedy,  MRN: [id number]  PCP: Pcp, No  Date of Evaluation: 10/27/2021 Time spent:20 minutes  Chief Complaint:  Chief Complaint   Anxiety; Depression; Follow-up    Virtual Visit via Telehealth  I connected with patient by a video enabled telemedicine application with their informed consent, and verified patient privacy and that I am speaking with the correct person using two identifiers.  I am private, in my office and the patient is at home.  I discussed the limitations, risks, security and privacy concerns of performing an evaluation and management service by video and the availability of in person appointments. I also discussed with the patient that there may be a patient responsible charge related to this service. The patient expressed understanding and agreed to proceed.   I discussed the assessment and treatment plan with the patient. The patient was provided an opportunity to ask questions and all were answered. The patient agreed with the plan and demonstrated an understanding of the instructions.   The patient was advised to call back or seek an in-person evaluation if the symptoms worsen or if the condition fails to improve as anticipated.  I provided 20 minutes of non-face-to-face time during this encounter.    HISTORY/CURRENT STATUS: HPI For routine med check.  Natasha Kennedy has been doing very well the past 6 months.  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  Feels that her medications are working well.  She does still have anxiety but Xanax is helpful when needed.  Not having panic attacks so much as just feeling a sense of unease, like something bad is going to happen if she does not take the Xanax.  States she sleeps well.  She does take melatonin every  night.  She wakes up feeling refreshed.  Obsessive thoughts and compulsive actions are not a problem.  Patient denies loss of interest in usual activities and is able to enjoy things.  Denies decreased energy or motivation.  Appetite has not changed.  No extreme sadness, tearfulness, or feelings of hopelessness.  Denies any changes in concentration, making decisions or remembering things.  Denies suicidal or homicidal thoughts.  Patient denies increased energy with decreased need for sleep, no increased talkativeness, no racing thoughts, no impulsivity or risky behaviors, no increased spending, no increased libido, no grandiosity, no increased irritability or anger, no paranoia and no hallucinations.  Denies dizziness, syncope, seizures, numbness, tingling, tremor, tics, unsteady gait, slurred speech, confusion. Denies muscle or joint pain, stiffness, or dystonia.  Individual Medical History/ Review of Systems: Changes? :No    Past medications for mental health diagnoses include: Luvox, BuSpar, Xanax, Seroquel, Abilify caused nausea and vomiting, Effexor, clomipramine  Allergies: Patient has no known allergies.  Current Medications:  Current Outpatient Medications:    clomiPRAMINE (ANAFRANIL) 50 MG capsule, TAKE 1 CAPSULE(50 MG) BY MOUTH AT BEDTIME, Disp: 30 capsule, Rfl: 5   Melatonin 3-10 MG TABS, Take by mouth at bedtime as needed., Disp: , Rfl:    Multiple Vitamin (MULTIVITAMIN) tablet, Take 1 tablet by mouth daily., Disp: , Rfl:    ALPRAZolam (XANAX) 0.25 MG tablet, TAKE 1 TABLET(0.25 MG) BY MOUTH THREE TIMES DAILY AS NEEDED, Disp: 90 tablet, Rfl: 5   busPIRone (BUSPAR) 30 MG tablet, TAKE 1 TABLET(30 MG)  BY MOUTH TWICE DAILY FOR ANXIETY, Disp: 60 tablet, Rfl: 5   fluvoxaMINE (LUVOX) 100 MG tablet, 2 po bid, Disp: 120 tablet, Rfl: 5 Medication Side Effects: none  Family Medical/ Social History: Changes?  Yes, now is a Architectural technologist, third grade, at a new school.  MENTAL HEALTH  EXAM:  Last menstrual period 04/26/2016.There is no height or weight on file to calculate BMI.  General Appearance: Casual, Neat and Well Groomed  Eye Contact:  Good  Speech:  Clear and Coherent and Normal Rate  Volume:  Normal  Mood:  Euthymic  Affect:  Congruent  Thought Process:  Goal Directed and Descriptions of Associations: Circumstantial  Orientation:  Full (Time, Place, and Person)  Thought Content: Logical   Suicidal Thoughts:  No  Homicidal Thoughts:  No  Memory:  WNL  Judgement:  Good  Insight:  Good  Psychomotor Activity:  Normal  Concentration:  Concentration: Good  Recall:  Good  Fund of Knowledge: Good  Language: Good  Assets:  Desire for Improvement  ADL's:  Intact  Cognition: WNL  Prognosis:  Good    DIAGNOSES:    ICD-10-CM   1. Mixed obsessional thoughts and acts  F42.2     2. Recurrent major depressive disorder, in partial remission (HCC)  F33.41     3. Generalized anxiety disorder  F41.1         Receiving Psychotherapy: No    RECOMMENDATIONS:  PDMP was reviewed.  Last Xanax filled 10/10/2021. I provided 20 minutes of non-face-to-face time during this encounter, including time spent before and after the visit in records review, medical decision making, counseling pertinent to today's visit, and charting.  I am glad to see her doing so well!  No changes in meds are necessary. Continue Xanax 0.25 mg, 1 p.o. 3 times daily (she usually takes 1 p.o. every morning and 2 p.o. nightly.) Continue BuSpar 30 mg, 1 p.o. twice daily. Continue clomipramine 50 mg, 1 p.o. nightly. Continue Luvox 100 mg, 4 p.o. nightly. Return in 6 months.  [name], PA-C

## 2022-03-29 ENCOUNTER — Other Ambulatory Visit: Payer: Self-pay | Admitting: Physician Assistant

## 2022-04-01 ENCOUNTER — Other Ambulatory Visit: Payer: Self-pay | Admitting: Physician Assistant

## 2022-04-03 ENCOUNTER — Other Ambulatory Visit: Payer: Self-pay | Admitting: Physician Assistant

## 2022-04-24 ENCOUNTER — Ambulatory Visit: Payer: BC Managed Care – PPO | Admitting: Internal Medicine

## 2022-04-24 ENCOUNTER — Encounter: Payer: Self-pay | Admitting: Internal Medicine

## 2022-04-24 VITALS — BP 114/78 | HR 74 | Temp 97.9°F | Ht 63.0 in | Wt 153.0 lb

## 2022-04-24 DIAGNOSIS — F411 Generalized anxiety disorder: Secondary | ICD-10-CM

## 2022-04-24 DIAGNOSIS — F422 Mixed obsessional thoughts and acts: Secondary | ICD-10-CM

## 2022-04-24 DIAGNOSIS — Z803 Family history of malignant neoplasm of breast: Secondary | ICD-10-CM | POA: Diagnosis not present

## 2022-04-24 DIAGNOSIS — R635 Abnormal weight gain: Secondary | ICD-10-CM

## 2022-04-24 NOTE — Assessment & Plan Note (Signed)
She reports a weight gain of nearly 30 lbs since December and freely indulges in chocolate and ice cream but is not exercising.  Screening for thyroid and diabetes ordered.  counselling on diet and exercise given

## 2022-04-24 NOTE — Assessment & Plan Note (Signed)
managed with buspirone and alprazolam

## 2022-04-24 NOTE — Progress Notes (Signed)
Subjective:  Patient ID: Natasha Kennedy, female    DOB: 01/02/1961  Age: 61 y.o. MRN: [id number]  CC: The primary encounter diagnosis was Weight gain. Diagnoses of Mixed obsessional thoughts and acts, GAD (generalized anxiety disorder), and Family history of breast cancer were also pertinent to this visit.  HPI Eisele Downham presents for establishment of care . Referred by mother Gweneth Fritter    1) OCD:  2) weight gain:   30 lb weight gain since December.  Not exercising .  Got tired of restricting sugar . 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    History Haliee has a past medical history of Anxiety, Depression, Ectopic pregnancy (1994), Family history of breast cancer (2013), OCD (obsessive compulsive disorder), Plantar fasciitis of left foot, and Psoriasis.   She has a past surgical history that includes Cholecystectomy (2008); Rhinoplasty (1978); Ectopic pregnancy surgery (1994); Colonoscopy (01/2014); and Wisdom tooth extraction.   Her family history includes Arthritis in her mother; Breast cancer (age of onset: 54) in her sister; Breast cancer (age of onset: 13) in her cousin; Breast cancer (age of onset: 45) in her paternal aunt; Breast cancer (age of onset: 26) in her mother; Breast cancer (age of onset: 43) in her maternal grandmother; Cancer in her mother and sister; Depression in her maternal grandmother; Heart disease in her father, mother, and paternal grandmother; Hip fracture in her mother; Hyperlipidemia in her father and paternal grandmother; Hypertension in her brother, father, mother, and sister.She reports that she has never smoked. She has never used smokeless tobacco. She reports that she does not drink alcohol and does not use drugs.  Outpatient Medications Prior to Visit  Medication Sig Dispense Refill   ALPRAZolam (XANAX) 0.25 MG tablet TAKE 1 TABLET(0.25 MG) BY MOUTH THREE TIMES DAILY AS NEEDED 90 tablet 5   busPIRone  (BUSPAR) 30 MG tablet TAKE 1 TABLET(30 MG) BY MOUTH TWICE DAILY FOR ANXIETY 60 tablet 5   clomiPRAMINE (ANAFRANIL) 50 MG capsule TAKE 1 CAPSULE(50 MG) BY MOUTH AT BEDTIME 30 capsule 1   fluvoxaMINE (LUVOX) 100 MG tablet 2 po bid 120 tablet 5   Melatonin 3-10 MG TABS Take by mouth at bedtime as needed.     Multiple Vitamin (MULTIVITAMIN) tablet Take 1 tablet by mouth daily.     No facility-administered medications prior to visit.    Review of Systems:  Patient denies headache, fevers, malaise, unintentional weight loss, skin rash, eye pain, sinus congestion and sinus pain, sore throat, dysphagia,  hemoptysis , cough, dyspnea, wheezing, chest pain, palpitations, orthopnea, edema, abdominal pain, nausea, melena, diarrhea, constipation, flank pain, dysuria, hematuria, urinary  Frequency, nocturia, numbness, tingling, seizures,  Focal weakness, Loss of consciousness,  Tremor, insomnia, depression, anxiety, and suicidal ideation.     Objective:  BP 114/78 (BP Location: Left Arm, Patient Position: Sitting, Cuff Size: Normal)   Pulse 74   Temp 97.9 F (36.6 C) (Oral)   Ht 5\' 3"  (1.6 m)   Wt 153 lb (69.4 kg)   LMP 04/26/2016 (Approximate) Comment: Just had spotting only  SpO2 98%   BMI 27.10 kg/m   Physical Exam:  General appearance: alert, cooperative and appears stated age Ears: normal TM's and external ear canals both ears Throat: lips, mucosa, and tongue normal; teeth and gums normal Neck: no adenopathy, no carotid bruit, supple, symmetrical, trachea midline and thyroid not enlarged, symmetric, no tenderness/mass/nodules Back: symmetric, no curvature. ROM normal. No CVA tenderness.  Lungs: clear to auscultation bilaterally Heart: regular rate and rhythm, S1, S2 normal, no murmur, click, rub or gallop Abdomen: soft, non-tender; bowel sounds normal; no masses,  no organomegaly Pulses: 2+ and symmetric Skin: Skin color, texture, turgor normal. No rashes or lesions Lymph nodes: Cervical,  supraclavicular, and axillary nodes normal.   Assessment & Plan:   Problem List Items Addressed This Visit     GAD (generalized anxiety disorder)    managed with buspirone and alprazolam      OCD (obsessive compulsive disorder)    First manifested during her second pregnancy,  Has been well managed with Luvox, anafranil, buspirone and alprazolam (ordered by Psychiatry)      Weight gain - Primary    She reports a weight gain of nearly 30 lbs since December and freely indulges in chocolate and ice cream but is not exercising.  Screening for thyroid and diabetes ordered.  counselling on diet and exercise given      Relevant Orders   Lipid panel   TSH   Hemoglobin A1c   Comprehensive metabolic panel   Family history of breast cancer    Continue annual 3d screening        I am having [name] maintain her multivitamin, Melatonin, ALPRAZolam, fluvoxaMINE, clomiPRAMINE, and busPIRone.  No orders of the defined types were placed in this encounter.   There are no discontinued medications.  Follow-up: Return in about 3 months (around 07/25/2022).   Sherlene Shams, MD

## 2022-04-27 ENCOUNTER — Ambulatory Visit: Payer: BC Managed Care – PPO | Admitting: Physician Assistant

## 2022-04-27 ENCOUNTER — Encounter: Payer: Self-pay | Admitting: Physician Assistant

## 2022-04-27 DIAGNOSIS — F422 Mixed obsessional thoughts and acts: Secondary | ICD-10-CM

## 2022-04-27 DIAGNOSIS — F411 Generalized anxiety disorder: Secondary | ICD-10-CM

## 2022-04-27 DIAGNOSIS — F3341 Major depressive disorder, recurrent, in partial remission: Secondary | ICD-10-CM | POA: Diagnosis not present

## 2022-04-27 MED ORDER — ALPRAZOLAM 0.25 MG PO TABS: ORAL_TABLET | ORAL | 5 refills | Status: DC

## 2022-04-27 MED ORDER — FLUVOXAMINE MALEATE 100 MG PO TABS: ORAL_TABLET | ORAL | 5 refills | Status: DC

## 2022-04-27 MED ORDER — CLOMIPRAMINE HCL 25 MG PO CAPS: 25.0000 mg | ORAL_CAPSULE | Freq: Every day | ORAL | 5 refills | Status: DC

## 2022-04-27 NOTE — Progress Notes (Signed)
[facility]  Patient ID: Natasha Kennedy,  MRN: [id number]  PCP: Sherlene Shams, MD  Date of Evaluation: 04/27/2022 Time spent:20 minutes  Chief Complaint:  Chief Complaint   Anxiety; Depression; Follow-up    HISTORY/CURRENT STATUS: HPI For routine med check.  For the past few years, has noticed that she feels more flat emotionally.  Does not cry when she feels like she should.  Does get irritable and angry easy but it is in a situation when it is warranted.  Just quick-tempered.  She wonders if the Xanax may be the cause of this dullness.  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  Does not have a lot of energy or motivation.  She lets housework or things like that go, things are not dirty but she only does the minimum.  ADLs and personal hygiene are normal.  Appetite is normal and weight is stable.  She has gained a few pounds over this past year.  No suicidal or homicidal thoughts.  Does get anxious at times but she is taking the Xanax routinely, not as needed.  She takes 0.25 mg in the morning and 0.5 mg in the evening.  Does not have panic attacks.  No increased energy with decreased need for sleep, no increased talkativeness, no racing thoughts, no impulsivity or risky behaviors, no increased spending, no increased libido, no grandiosity, no increased irritability or anger, no paranoia and no hallucinations.  Denies dizziness, syncope, seizures, numbness, tingling, tremor, tics, unsteady gait, slurred speech, confusion. Denies muscle or joint pain, stiffness, or dystonia.  Individual Medical History/ Review of Systems: Changes? :No    Past medications for mental health diagnoses include: Luvox, BuSpar, Xanax, Seroquel, Abilify caused nausea and vomiting, Effexor, clomipramine  Allergies: Patient has no known allergies.  Current Medications:  Current Outpatient Medications:     busPIRone (BUSPAR) 30 MG tablet, TAKE 1 TABLET(30 MG) BY MOUTH TWICE DAILY FOR ANXIETY, Disp: 60 tablet, Rfl: 5   clomiPRAMINE (ANAFRANIL) 25 MG capsule, Take 1 capsule (25 mg total) by mouth at bedtime., Disp: 30 capsule, Rfl: 5   clomiPRAMINE (ANAFRANIL) 50 MG capsule, TAKE 1 CAPSULE(50 MG) BY MOUTH AT BEDTIME, Disp: 30 capsule, Rfl: 1   Melatonin 3-10 MG TABS, Take by mouth at bedtime as needed., Disp: , Rfl:    Multiple Vitamin (MULTIVITAMIN) tablet, Take 1 tablet by mouth daily., Disp: , Rfl:    ALPRAZolam (XANAX) 0.25 MG tablet, TAKE 1 TABLET(0.25 MG) BY MOUTH THREE TIMES DAILY AS NEEDED, Disp: 90 tablet, Rfl: 5   fluvoxaMINE (LUVOX) 100 MG tablet, 2 po bid, Disp: 120 tablet, Rfl: 5 Medication Side Effects: none  Family Medical/ Social History: Changes?  Yes, now is a Architectural technologist, third grade, at a new school.  MENTAL HEALTH EXAM:  Last menstrual period 04/26/2016.There is no height or weight on file to calculate BMI.  General Appearance: Casual, Neat and Well Groomed  Eye Contact:  Good  Speech:  Clear and Coherent and Normal Rate  Volume:  Normal  Mood:  Euthymic  Affect:  Congruent  Thought Process:  Goal Directed and Descriptions of Associations: Circumstantial  Orientation:  Full (Time, Place, and Person)  Thought Content: Logical   Suicidal Thoughts:  No  Homicidal Thoughts:  No  Memory:  WNL  Judgement:  Good  Insight:  Good  Psychomotor Activity:  Normal  Concentration:  Concentration: Good  Recall:  Good  Fund of Knowledge: Good  Language: Good  Assets:  Desire for Improvement Financial Resources/Insurance Housing Transportation Vocational/Educational  ADL's:  Intact  Cognition: WNL  Prognosis:  Good    DIAGNOSES:    ICD-10-CM   1. Mixed obsessional thoughts and acts  F42.2     2. Generalized anxiety disorder  F41.1     3. Recurrent major depressive disorder, in partial remission (HCC)  F33.41       Receiving Psychotherapy: No    RECOMMENDATIONS:  PDMP was reviewed.  Last Xanax filled 04/12/2022. I provided 20 minutes of face to face time during this encounter, including time spent before and after the visit in records review, medical decision making, counseling pertinent to today's visit, and charting.   I recommend adding Wellbutrin which would help with energy and motivation.  She does not want to add another medication into the mix.  She feels that the Xanax may be a part of the flattening of her mood and would like to decrease that if that is okay.  I am fine with that.  Another option could be to decrease the Luvox or clomipramine or both, but with her severe OCD we would be taking a chance on that worsening.  Decrease the Xanax 0.25 mg by taking 1 pill in the morning and 1.5 pills at bedtime for 2 weeks, and then decrease to 1 pill twice a day until our next visit. Continue BuSpar 30 mg, 1 p.o. twice daily. Continue clomipramine 50 mg, 1 p.o. nightly. Continue Luvox 100 mg, 4 p.o. nightly. Return in 4 to 6 weeks.  [name], PA-C

## 2022-04-27 NOTE — Patient Instructions (Signed)
Decrease the Xanax 0.25 mg by taking 1 pill in the morning and 1.5 pills at bedtime for 2 weeks, and then decreased to 1 pill twice a day until our next visit.

## 2022-05-12 ENCOUNTER — Telehealth: Payer: Self-pay | Admitting: Physician Assistant

## 2022-05-12 NOTE — Telephone Encounter (Signed)
Pt LVM @! 1:44p.  She said at her visit with Rosey Bath on 6/29, Rosey Bath change her dosage on a medicine.  She is having side effects and wants to know what to do.  Next appt 8/11

## 2022-05-12 NOTE — Telephone Encounter (Signed)
Pt stated she has been decreasing xanax dose.She was fine when taking 1.5 tabs ,now she is down to only taking 1 tab and has not been able to sleep and is very anxious.Please advise if she should continue 1.5 tab daily

## 2022-05-12 NOTE — Telephone Encounter (Signed)
Yes, have her take 1 pill (0.25 mg ) in the morning and 1 1/2 pills (0.375 mg) at night.

## 2022-05-12 NOTE — Telephone Encounter (Signed)
Pt informed

## 2022-05-13 ENCOUNTER — Other Ambulatory Visit: Payer: Self-pay | Admitting: Physician Assistant

## 2022-06-08 ENCOUNTER — Other Ambulatory Visit: Payer: Self-pay | Admitting: Physician Assistant

## 2022-06-08 NOTE — Telephone Encounter (Signed)
Has appt with Teresa tomorrow ?

## 2022-06-09 ENCOUNTER — Encounter: Payer: Self-pay | Admitting: Physician Assistant

## 2022-06-09 ENCOUNTER — Ambulatory Visit: Payer: BC Managed Care – PPO | Admitting: Physician Assistant

## 2022-06-09 DIAGNOSIS — F422 Mixed obsessional thoughts and acts: Secondary | ICD-10-CM

## 2022-06-09 DIAGNOSIS — F411 Generalized anxiety disorder: Secondary | ICD-10-CM | POA: Diagnosis not present

## 2022-06-09 MED ORDER — CLOMIPRAMINE HCL 50 MG PO CAPS: 50.0000 mg | ORAL_CAPSULE | Freq: Every day | ORAL | 11 refills | Status: DC

## 2022-06-09 MED ORDER — ALPRAZOLAM 0.25 MG PO TABS: ORAL_TABLET | ORAL | 5 refills | Status: DC

## 2022-06-09 NOTE — Progress Notes (Signed)
[facility]  Patient ID: Natasha Kennedy,  MRN: [id number]  PCP: Sherlene Shams, MD  Date of Evaluation: 06/09/2022 Time spent:20 minutes  Chief Complaint:  Chief Complaint   Anxiety; Follow-up    HISTORY/CURRENT STATUS: HPI For routine med check.  At our visit 6 weeks ago we attempted to decrease the Xanax dose, she was experiencing emotional flattening and wanted to see if it was related to that.  She was not able to tolerate it though.  When she decreased the nightly dose she was not able to sleep at all so she went back on the previous dose and has been doing well since then.  Not having panic attacks.  She does get overwhelmed at times but it is triggered.  The Xanax is helpful.  Patient is able to enjoy things.  Energy and motivation are good.  Starts back to school in 1 week.  No extreme sadness, tearfulness, or feelings of hopelessness.  Sleeps well most of the time. ADLs and personal hygiene are normal.   Denies any changes in concentration, making decisions, or remembering things.  Appetite has not changed.  Weight is stable. Denies suicidal or homicidal thoughts.  Denies dizziness, syncope, seizures, numbness, tingling, tremor, tics, unsteady gait, slurred speech, confusion. Denies muscle or joint pain, stiffness, or dystonia.  Individual Medical History/ Review of Systems: Changes? :No    Past medications for mental health diagnoses include: Luvox, BuSpar, Xanax, Seroquel, Abilify caused nausea and vomiting, Effexor, clomipramine  Allergies: Patient has no known allergies.  Current Medications:  Current Outpatient Medications:    busPIRone (BUSPAR) 30 MG tablet, TAKE 1 TABLET(30 MG) BY MOUTH TWICE DAILY FOR ANXIETY, Disp: 60 tablet, Rfl: 5   fluvoxaMINE (LUVOX) 100 MG tablet, 2 po bid, Disp: 120 tablet, Rfl: 5   Melatonin 3-10 MG TABS, Take by mouth at bedtime as needed., Disp: , Rfl:    Multiple Vitamin (MULTIVITAMIN) tablet, Take 1 tablet by mouth daily.,  Disp: , Rfl:    ALPRAZolam (XANAX) 0.25 MG tablet, 1 p.o. every morning and 2 p.o. nightly, Disp: 90 tablet, Rfl: 5   clomiPRAMINE (ANAFRANIL) 50 MG capsule, Take 1 capsule (50 mg total) by mouth at bedtime., Disp: 30 capsule, Rfl: 11 Medication Side Effects: none  Family Medical/ Social History: Changes?  no  MENTAL HEALTH EXAM:  Last menstrual period 04/26/2016.There is no height or weight on file to calculate BMI.  General Appearance: Casual, Neat and Well Groomed  Eye Contact:  Good  Speech:  Clear and Coherent and Normal Rate  Volume:  Normal  Mood:  Euthymic  Affect:  Congruent  Thought Process:  Goal Directed and Descriptions of Associations: Circumstantial  Orientation:  Full (Time, Place, and Person)  Thought Content: Logical   Suicidal Thoughts:  No  Homicidal Thoughts:  No  Memory:  WNL  Judgement:  Good  Insight:  Good  Psychomotor Activity:  Normal  Concentration:  Concentration: Good and Attention Span: Good  Recall:  Good  Fund of Knowledge: Good  Language: Good  Assets:  Desire for Improvement Financial Resources/Insurance Housing Transportation Vocational/Educational  ADL's:  Intact  Cognition: WNL  Prognosis:  Good   DIAGNOSES:    ICD-10-CM   1. Mixed obsessional thoughts and acts  F42.2     2. Generalized anxiety disorder  F41.1       Receiving Psychotherapy: No   RECOMMENDATIONS:  PDMP was reviewed.  Last Xanax filled 05/12/2022. I provided 20 minutes of face to face time during  this encounter, including time spent before and after the visit in records review, medical decision making, counseling pertinent to today's visit, and charting.   I am sorry to hear that she was not able to tolerate the decreased dose of Xanax.  We agree is best to stay on it for now, at the same dose she was on prior to our June appointment.  If she still feels like there is emotional flattening at our next visit then we can talk about decreasing the clomipramine or  Luvox which are the likely culprits.  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  Continue Xanax 0.25 mg 1 p.o. every morning and 2 p.o. nightly. Continue BuSpar 30 mg, 1 p.o. twice daily. Continue clomipramine 50 mg, 1 p.o. nightly. Continue Luvox 100 mg, 4 p.o. nightly. Return in 4 months.  [name], PA-C

## 2022-07-20 ENCOUNTER — Other Ambulatory Visit (INDEPENDENT_AMBULATORY_CARE_PROVIDER_SITE_OTHER): Payer: BC Managed Care – PPO

## 2022-07-20 DIAGNOSIS — R635 Abnormal weight gain: Secondary | ICD-10-CM | POA: Diagnosis not present

## 2022-07-20 LAB — COMPREHENSIVE METABOLIC PANEL
ALT: 48 U/L — ABNORMAL HIGH (ref 0–35)
AST: 30 U/L (ref 0–37)
Albumin: 4.1 g/dL (ref 3.5–5.2)
Alkaline Phosphatase: 49 U/L (ref 39–117)
BUN: 21 mg/dL (ref 6–23)
CO2: 28 mEq/L (ref 19–32)
Calcium: 10.1 mg/dL (ref 8.4–10.5)
Chloride: 103 mEq/L (ref 96–112)
Creatinine, Ser: 1.02 mg/dL (ref 0.40–1.20)
GFR: 60.53 mL/min (ref >60.00)
Glucose, Bld: 150 mg/dL — ABNORMAL HIGH (ref 70–99)
Potassium: 3.9 mEq/L (ref 3.5–5.1)
Sodium: 138 mEq/L (ref 135–145)
Total Bilirubin: 0.4 mg/dL (ref 0.2–1.2)
Total Protein: 7.4 g/dL (ref 6.0–8.3)

## 2022-07-20 LAB — LIPID PANEL
Cholesterol: 216 mg/dL — ABNORMAL HIGH (ref 0–200)
HDL: 55.3 mg/dL (ref >39.00)
LDL Cholesterol: 132 mg/dL — ABNORMAL HIGH (ref 0–99)
NonHDL: 160.33
Total CHOL/HDL Ratio: 4
Triglycerides: 142 mg/dL (ref 0.0–149.0)
VLDL: 28.4 mg/dL (ref 0.0–40.0)

## 2022-07-20 LAB — TSH: TSH: 1.56 u[IU]/mL (ref 0.35–5.50)

## 2022-07-24 LAB — HEMOGLOBIN A1C: Hgb A1c MFr Bld: 6.4 % (ref 4.6–6.5)

## 2022-07-26 ENCOUNTER — Other Ambulatory Visit (HOSPITAL_COMMUNITY)
Admission: RE | Admit: 2022-07-26 | Discharge: 2022-07-26 | Disposition: A | Discharge status: Home / Self Care | Payer: BC Managed Care – PPO | Source: Ambulatory Visit | Attending: Internal Medicine | Admitting: Internal Medicine

## 2022-07-26 ENCOUNTER — Ambulatory Visit (INDEPENDENT_AMBULATORY_CARE_PROVIDER_SITE_OTHER): Payer: BC Managed Care – PPO | Admitting: Internal Medicine

## 2022-07-26 ENCOUNTER — Encounter: Payer: Self-pay | Admitting: Internal Medicine

## 2022-07-26 VITALS — BP 114/84 | HR 86 | Temp 97.8°F | Ht 63.0 in | Wt 154.4 lb

## 2022-07-26 DIAGNOSIS — Z1231 Encounter for screening mammogram for malignant neoplasm of breast: Secondary | ICD-10-CM | POA: Diagnosis not present

## 2022-07-26 DIAGNOSIS — Z0000 Encounter for general adult medical examination without abnormal findings: Secondary | ICD-10-CM

## 2022-07-26 DIAGNOSIS — Z124 Encounter for screening for malignant neoplasm of cervix: Secondary | ICD-10-CM | POA: Diagnosis not present

## 2022-07-26 DIAGNOSIS — F422 Mixed obsessional thoughts and acts: Secondary | ICD-10-CM

## 2022-07-26 DIAGNOSIS — Z23 Encounter for immunization: Secondary | ICD-10-CM

## 2022-07-26 DIAGNOSIS — R7303 Prediabetes: Secondary | ICD-10-CM | POA: Diagnosis not present

## 2022-07-26 NOTE — Progress Notes (Signed)
The patient is here for annual preventive examination and management of other chronic and acute problems.   The risk factors are reflected in the social history.   The roster of all physicians providing medical care to patient - is listed in the Snapshot section of the chart.   Activities of daily living:  The patient is 100% independent in all ADLs: dressing, toileting, feeding as well as independent mobility   Home safety : The patient has smoke detectors in the home. They wear seatbelts.  There are no unsecured firearms at home. There is no violence in the home.    There is no risks for hepatitis, STDs or HIV. There is no   history of blood transfusion. They have no travel history to infectious disease endemic areas of the world.   The patient has seen their dentist in the last six month. They have seen their eye doctor in the last year. The patinet  denies slight hearing difficulty with regard to whispered voices and some television programs.  They have deferred audiologic testing in the last year.  They do not  have excessive sun exposure. Discussed the need for sun protection: hats, long sleeves and use of sunscreen if there is significant sun exposure.    Diet: the importance of a healthy diet is discussed. They do have a healthy diet.   The benefits of regular aerobic exercise were discussed. The patient  exercises  3 to 5 days per week  for  60 minutes.    Depression screen: there are no signs or vegative symptoms of depression- irritability, change in appetite, anhedonia, sadness/tearfullness.   The following portions of the patient's history were reviewed and updated as appropriate: allergies, current medications, past family history, past medical history,  past surgical history, past social history  and problem list.   Visual acuity was not assessed per patient preference since the patient has regular follow up with an  ophthalmologist. Hearing and body mass index were assessed and  reviewed.    During the course of the visit the patient was educated and counseled about appropriate screening and preventive services including : fall prevention , diabetes screening, nutrition counseling, colorectal cancer screening, and recommended immunizations.    Chief Complaint:  None.    Review of Symptoms  Patient denies headache, fevers, malaise, unintentional weight loss, skin rash, eye pain, sinus congestion and sinus pain, sore throat, dysphagia,  hemoptysis , cough, dyspnea, wheezing, chest pain, palpitations, orthopnea, edema, abdominal pain, nausea, melena, diarrhea, constipation, flank pain, dysuria, hematuria, urinary  Frequency, nocturia, numbness, tingling, seizures,  Focal weakness, Loss of consciousness,  Tremor, insomnia, depression, anxiety, and suicidal ideation.    Physical Exam:  BP 114/84 (BP Location: Left Arm, Patient Position: Sitting, Cuff Size: Normal)   Pulse 86   Temp 97.8 F (36.6 C) (Oral)   Ht 5\' 3"  (1.6 m)   Wt 154 lb 6.4 oz (70 kg)   LMP 04/26/2016 (Approximate) Comment: Just had spotting only  SpO2 99%   BMI 27.35 kg/m    General Appearance:    Alert, cooperative, no distress, appears stated age  Head:    Normocephalic, without obvious abnormality, atraumatic  Eyes:    PERRL, conjunctiva/corneas clear, EOM's intact, fundi    benign, both eyes  Ears:    Normal TM's and external ear canals, both ears  Nose:   Nares normal, septum midline, mucosa normal, no drainage    or sinus tenderness  Throat:   Lips, mucosa, and  tongue normal; teeth and gums normal  Neck:   Supple, symmetrical, trachea midline, no adenopathy;    thyroid:  no enlargement/tenderness/nodules; no carotid   bruit or JVD  Back:     Symmetric, no curvature, ROM normal, no CVA tenderness  Lungs:     Clear to auscultation bilaterally, respirations unlabored  Chest Wall:    No tenderness or deformity   Heart:    Regular rate and rhythm, S1 and S2 normal, no murmur, rub   or  gallop  Breast Exam:    No tenderness, masses, or nipple abnormality  Abdomen:     Soft, non-tender, bowel sounds active all four quadrants,    no masses, no organomegaly  Genitalia:    Pelvic: cervix normal in appearance, external genitalia normal, no adnexal masses or tenderness, no cervical motion tenderness, rectovaginal septum normal, uterus normal size, shape, and consistency and vagina normal without discharge  Extremities:   Extremities normal, atraumatic, no cyanosis or edema  Pulses:   2+ and symmetric all extremities  Skin:   Skin color, texture, turgor normal, no rashes or lesions  Lymph nodes:   Cervical, supraclavicular, and axillary nodes normal  Neurologic:   CNII-XII intact, normal strength, sensation and reflexes    throughout      Assessment and Plan:  OCD (obsessive compulsive disorder) Remains well managed with Luvox, anafranil, buspirone and alprazolam (ordered by Psychiatry)  Encounter for preventive health examination age appropriate education and counseling updated, referrals for preventative services and immunizations addressed, dietary and smoking counseling addressed, most recent labs reviewed.  I have personally reviewed and have noted:   1) the patient's medical and social history 2) The pt's use of alcohol, tobacco, and illicit drugs 3) The patient's current medications and supplements 4) Functional ability including ADL's, fall risk, home safety risk, hearing and visual impairment 5) Diet and physical activities 6) Evidence for depression or mood disorder 7) The patient's height, weight, and BMI have been recorded in the chart  I have made referrals, and provided counseling and education based on review of the above  Prediabetes Patient's A1c and fasting glucose are  suggestive of increased risk for  type 2 DM.  Lifestyle recommendations reviewed with patient today with regard to diet and exercise.    Updated Medication List Outpatient Encounter  Medications as of 07/26/2022  Medication Sig   ALPRAZolam (XANAX) 0.25 MG tablet 1 p.o. every morning and 2 p.o. nightly   busPIRone (BUSPAR) 30 MG tablet TAKE 1 TABLET(30 MG) BY MOUTH TWICE DAILY FOR ANXIETY   clomiPRAMINE (ANAFRANIL) 50 MG capsule Take 1 capsule (50 mg total) by mouth at bedtime.   fluvoxaMINE (LUVOX) 100 MG tablet 2 po bid   Melatonin 3-10 MG TABS Take by mouth at bedtime as needed.   Multiple Vitamin (MULTIVITAMIN) tablet Take 1 tablet by mouth daily.   No facility-administered encounter medications on file as of 07/26/2022.

## 2022-07-26 NOTE — Patient Instructions (Addendum)
Your annual mammogram has been ordered AND IS DUE .  Natasha Kennedy will not allow [place] to schedule it for you,  so please  call to make your appointment [phone]    Your  fasting glucose has never been  diagnostic of diabetes; but your A1c of 6.4 suggests that you  are at increased  risk for developing type 2 Diabetes over the next ten years.  Reducing your daily intake of starches to 2 servings daily , eliminating refined sugars and getting back to a daily exercise regimen  has been shown to delay the progression to diabetes.   I would like to repeat NON FASTING labs In 4 months.  Here are a couple of good low carb ready made meals  Premier Protein shakes KIND low GI bars,  Quest bars, ATkins bars Veggies made [place]!  Makes a low carb spinach/egg white frittata

## 2022-07-27 DIAGNOSIS — R7303 Prediabetes: Secondary | ICD-10-CM | POA: Insufficient documentation

## 2022-07-27 DIAGNOSIS — Z0000 Encounter for general adult medical examination without abnormal findings: Secondary | ICD-10-CM | POA: Insufficient documentation

## 2022-07-27 NOTE — Assessment & Plan Note (Signed)
Remains well managed with Luvox, anafranil, buspirone and alprazolam (ordered by Psychiatry) 

## 2022-07-27 NOTE — Assessment & Plan Note (Signed)
Patient's A1c and fasting glucose are  suggestive of increased risk for  type 2 DM.  Lifestyle recommendations reviewed with patient today with regard to diet and exercise.

## 2022-07-27 NOTE — Assessment & Plan Note (Signed)

## 2022-07-31 LAB — CYTOLOGY - PAP
Comment: NEGATIVE
Diagnosis: NEGATIVE
High risk HPV: NEGATIVE

## 2022-08-29 ENCOUNTER — Ambulatory Visit
Admission: RE | Admit: 2022-08-29 | Discharge: 2022-08-29 | Disposition: A | Discharge status: Home / Self Care | Payer: BC Managed Care – PPO | Source: Ambulatory Visit | Attending: Internal Medicine | Admitting: Internal Medicine

## 2022-08-29 DIAGNOSIS — Z1231 Encounter for screening mammogram for malignant neoplasm of breast: Secondary | ICD-10-CM | POA: Insufficient documentation

## 2022-10-26 ENCOUNTER — Telehealth (INDEPENDENT_AMBULATORY_CARE_PROVIDER_SITE_OTHER): Payer: BC Managed Care – PPO | Admitting: Physician Assistant

## 2022-10-26 ENCOUNTER — Encounter: Payer: Self-pay | Admitting: Physician Assistant

## 2022-10-26 DIAGNOSIS — F3342 Major depressive disorder, recurrent, in full remission: Secondary | ICD-10-CM | POA: Diagnosis not present

## 2022-10-26 DIAGNOSIS — F422 Mixed obsessional thoughts and acts: Secondary | ICD-10-CM

## 2022-10-26 DIAGNOSIS — F411 Generalized anxiety disorder: Secondary | ICD-10-CM | POA: Diagnosis not present

## 2022-10-26 MED ORDER — FLUVOXAMINE MALEATE 100 MG PO TABS: ORAL_TABLET | ORAL | 11 refills | Status: DC

## 2022-10-26 MED ORDER — ALPRAZOLAM 0.25 MG PO TABS: ORAL_TABLET | ORAL | 5 refills | Status: DC

## 2022-10-26 MED ORDER — BUSPIRONE HCL 30 MG PO TABS: 30.0000 mg | ORAL_TABLET | Freq: Two times a day (BID) | ORAL | 11 refills | Status: DC

## 2022-10-26 NOTE — Progress Notes (Signed)
[facility]  Patient ID: Natasha Kennedy,  MRN: [id number]  PCP: Sherlene Shams, MD  Date of Evaluation: 10/26/2022 Time spent:20 minutes  Chief Complaint:  Chief Complaint   Anxiety; Depression; Follow-up    Virtual Visit via Telehealth  I connected with patient by a video enabled telemedicine application with their informed consent, and verified patient privacy and that I am speaking with the correct person using two identifiers.  I am private, in my office and the patient is at home.  I discussed the limitations, risks, security and privacy concerns of performing an evaluation and management service by video and the availability of in person appointments. I also discussed with the patient that there may be a patient responsible charge related to this service. The patient expressed understanding and agreed to proceed.   I discussed the assessment and treatment plan with the patient. The patient was provided an opportunity to ask questions and all were answered. The patient agreed with the plan and demonstrated an understanding of the instructions.   The patient was advised to call back or seek an in-person evaluation if the symptoms worsen or if the condition fails to improve as anticipated.  I provided 20 minutes of non-face-to-face time during this encounter.  HISTORY/CURRENT STATUS: HPI For routine med check.  States she has had a good 6 months.  Feels like all of her medications are working well.  Patient is able to enjoy things, enjoys reading.  All of her siblings, nieces and nephews except for one niece and her family got together a few weeks ago for a wedding which was very nice.  Energy and motivation are good.  Work is going well.   No extreme sadness, tearfulness, or feelings of hopelessness.  Sleeps well most of the time. ADLs and personal hygiene are normal.   Denies any changes in concentration, making decisions, or remembering things.  Appetite has not  changed.  Weight is stable.  Denies suicidal or homicidal thoughts.  Does have anxiety, can have obsessions, ruminating thoughts at times but the Luvox and clomipramine have really helped that.  Has situational anxiety from time to time.  Takes the Xanax as needed, needs it every night to help her relax to go to sleep.  Patient denies increased energy with decreased need for sleep, increased talkativeness, racing thoughts, impulsivity or risky behaviors, increased spending, increased libido, grandiosity, increased irritability or anger, paranoia, or hallucinations.  Denies dizziness, syncope, seizures, numbness, tingling, tremor, tics, unsteady gait, slurred speech, confusion. Denies muscle or joint pain, stiffness, or dystonia.  Individual Medical History/ Review of Systems: Changes? :Yes   diagnosed with prediabetes in September.  She has been exercising and losing some weight even though she did not need to lose too much, will go back in January, hoping the results are better.  Also has an upper respiratory infection right now.  Past medications for mental health diagnoses include: Luvox, BuSpar, Xanax, Seroquel, Abilify caused nausea and vomiting, Effexor, clomipramine  Allergies: Patient has no known allergies.  Current Medications:  Current Outpatient Medications:    clomiPRAMINE (ANAFRANIL) 50 MG capsule, Take 1 capsule (50 mg total) by mouth at bedtime., Disp: 30 capsule, Rfl: 11   Melatonin 3-10 MG TABS, Take by mouth at bedtime as needed., Disp: , Rfl:    Multiple Vitamin (MULTIVITAMIN) tablet, Take 1 tablet by mouth daily., Disp: , Rfl:    ALPRAZolam (XANAX) 0.25 MG tablet, 1 p.o. every morning and 2 p.o. nightly, Disp: 90 tablet,  Rfl: 5   busPIRone (BUSPAR) 30 MG tablet, Take 1 tablet (30 mg total) by mouth 2 (two) times daily., Disp: 60 tablet, Rfl: 11   fluvoxaMINE (LUVOX) 100 MG tablet, 2 po bid, Disp: 120 tablet, Rfl: 11 Medication Side Effects: none  Family Medical/ Social  History: Changes?  no  MENTAL HEALTH EXAM:  Last menstrual period 04/26/2016.There is no height or weight on file to calculate BMI.  General Appearance: Casual, Neat and Well Groomed  Eye Contact:  Good  Speech:  Clear and Coherent and Normal Rate  Volume:  Normal  Mood:  Euthymic  Affect:  Congruent  Thought Process:  Goal Directed and Descriptions of Associations: Circumstantial  Orientation:  Full (Time, Place, and Person)  Thought Content: Logical   Suicidal Thoughts:  No  Homicidal Thoughts:  No  Memory:  WNL  Judgement:  Good  Insight:  Good  Psychomotor Activity:  Normal  Concentration:  Concentration: Good and Attention Span: Good  Recall:  Good  Fund of Knowledge: Good  Language: Good  Assets:  Desire for Improvement Financial Resources/Insurance Housing Resilience Social Support Transportation Vocational/Educational  ADL's:  Intact  Cognition: WNL  Prognosis:  Good   Labs from September 2023 were reviewed.  See on chart.  DIAGNOSES:    ICD-10-CM   1. Mixed obsessional thoughts and acts  F42.2     2. Generalized anxiety disorder  F41.1     3. Major depression, recurrent, full remission ([place])  F33.42      Receiving Psychotherapy: No   RECOMMENDATIONS:  PDMP was reviewed.  Last Xanax filled 10/12/2022. I provided 20 minutes of non-face-to-face time during this encounter, including time spent before and after the visit in records review, medical decision making, counseling pertinent to today's visit, and charting.   I am glad to see her doing well.  No changes in medications need to be made.  Continue Xanax 0.25 mg 1 p.o. every morning and 2 p.o. nightly as needed. Continue BuSpar 30 mg, 1 p.o. twice daily. Continue clomipramine 50 mg, 1 p.o. nightly. Continue Luvox 100 mg, 4 p.o. nightly. Continue multivitamin, also recommend B complex, fish oil, and vitamin D 2000 IUs. Return in 6 months.  Donnal Moat, PA-C

## 2022-11-23 ENCOUNTER — Telehealth: Payer: Self-pay | Admitting: Internal Medicine

## 2022-11-23 DIAGNOSIS — E782 Mixed hyperlipidemia: Secondary | ICD-10-CM

## 2022-11-23 DIAGNOSIS — R7303 Prediabetes: Secondary | ICD-10-CM

## 2022-11-23 NOTE — Addendum Note (Signed)
Addended by: Crecencio Mc on: 11/23/2022 01:42 PM   Modules accepted: Orders

## 2022-11-23 NOTE — Telephone Encounter (Signed)
Patient has a lab appt 11/27/2022, there are No orders in.

## 2022-11-27 ENCOUNTER — Other Ambulatory Visit (INDEPENDENT_AMBULATORY_CARE_PROVIDER_SITE_OTHER): Payer: BC Managed Care – PPO

## 2022-11-27 DIAGNOSIS — R7303 Prediabetes: Secondary | ICD-10-CM

## 2022-11-27 DIAGNOSIS — E782 Mixed hyperlipidemia: Secondary | ICD-10-CM

## 2022-11-27 LAB — COMPREHENSIVE METABOLIC PANEL
ALT: 15 U/L (ref 0–35)
AST: 15 U/L (ref 0–37)
Albumin: 4.2 g/dL (ref 3.5–5.2)
Alkaline Phosphatase: 71 U/L (ref 39–117)
BUN: 22 mg/dL (ref 6–23)
CO2: 28 mEq/L (ref 19–32)
Calcium: 10 mg/dL (ref 8.4–10.5)
Chloride: 103 mEq/L (ref 96–112)
Creatinine, Ser: 1.03 mg/dL (ref 0.40–1.20)
GFR: 59.68 mL/min — ABNORMAL LOW (ref >60.00)
Glucose, Bld: 113 mg/dL — ABNORMAL HIGH (ref 70–99)
Potassium: 4.5 mEq/L (ref 3.5–5.1)
Sodium: 141 mEq/L (ref 135–145)
Total Bilirubin: 0.3 mg/dL (ref 0.2–1.2)
Total Protein: 7.2 g/dL (ref 6.0–8.3)

## 2022-11-27 LAB — HEMOGLOBIN A1C: Hgb A1c MFr Bld: 6.4 % (ref 4.6–6.5)

## 2022-11-28 LAB — LIPID PANEL W/REFLEX DIRECT LDL
Cholesterol: 183 mg/dL (ref <200)
HDL: 67 mg/dL (ref >=50)
LDL Cholesterol (Calc): 97 mg/dL (calc)
Non-HDL Cholesterol (Calc): 116 mg/dL (calc) (ref <130)
Total CHOL/HDL Ratio: 2.7 (calc) (ref <5.0)
Triglycerides: 93 mg/dL (ref <150)

## 2022-11-29 ENCOUNTER — Ambulatory Visit: Payer: BC Managed Care – PPO | Admitting: Internal Medicine

## 2022-11-29 ENCOUNTER — Encounter: Payer: Self-pay | Admitting: Internal Medicine

## 2022-11-29 VITALS — BP 128/72 | HR 80 | Temp 98.1°F | Ht 63.0 in | Wt 138.2 lb

## 2022-11-29 DIAGNOSIS — R7303 Prediabetes: Secondary | ICD-10-CM | POA: Diagnosis not present

## 2022-11-29 DIAGNOSIS — F422 Mixed obsessional thoughts and acts: Secondary | ICD-10-CM | POA: Diagnosis not present

## 2022-11-29 NOTE — Patient Instructions (Addendum)
For the constipation :   I recommend adding One serving of Benefiber in water daily, and  Colace 100 to 200 mg at night   Do this regimen daily  Also:  you need a Minimum of 60 ounces of water daily.   Try drinking 16 ounces In the morning    To make a low carb chip :  Take the Joseph's Lavash or Pita bread,  Or the [place] Low carb whole wheat tortilla   Place on metal cookie sheet  Brush with olive oil  Sprinkle garlic powder (NOT garlic salt), grated parmesan cheese, mediterranean seasoning , or all of them?  Bake at 275 for 30 minutes   We have substitutions for your potatoes!!  Try the mashed cauliflower and riced cauliflower dishes instead of rice and mashed potatoes  Mashed turnips are also very low carb!   For desserts :  Try the Dannon [employer] Fit greek yogurt dessert flavors and top with reddi Whip .  8 carbs,  80 calories  Try Oikos Triple Zero [place] Yogurt in the salted caramel, and the coffee flavors  With Whipped Cream for dessert  breyer's low carb ice cream, available in bars (on a stick, better ) or scoopable ice cream  HERE ARE THE LOW CARB  BREAD CHOICES

## 2022-11-29 NOTE — Progress Notes (Unsigned)
Subjective:  Patient ID: Natasha Kennedy, female    DOB: Sep 12, 1961  Age: 62 y.o. MRN: [id number]  CC: There were no encounter diagnoses.   HPI [name] presents for  Chief Complaint  Patient presents with   Medical Management of Chronic Issues    4 month follow up    1) Overweight:  has lost 16 lbs sine September through diet and exercise   wants to get to 125 lb.  Walking up hill on a treadmill at home for 30 mintutes 50 days per week . Eating 3 meals daily.  Atkins bar ,  greek yogurt and balance breaks  snack,  and then a sensible dinner   2) prediabetes  random glucoses reviewed.    3) chronic constipation:  using miralax  Outpatient Medications Prior to Visit  Medication Sig Dispense Refill   ALPRAZolam (XANAX) 0.25 MG tablet 1 p.o. every morning and 2 p.o. nightly 90 tablet 5   busPIRone (BUSPAR) 30 MG tablet Take 1 tablet (30 mg total) by mouth 2 (two) times daily. 60 tablet 11   clomiPRAMINE (ANAFRANIL) 50 MG capsule Take 1 capsule (50 mg total) by mouth at bedtime. 30 capsule 11   fluvoxaMINE (LUVOX) 100 MG tablet 2 po bid 120 tablet 11   Melatonin 3-10 MG TABS Take by mouth at bedtime as needed.     Multiple Vitamin (MULTIVITAMIN) tablet Take 1 tablet by mouth daily. (Patient not taking: Reported on 11/29/2022)     No facility-administered medications prior to visit.    Review of Systems;  Patient denies headache, fevers, malaise, unintentional weight loss, skin rash, eye pain, sinus congestion and sinus pain, sore throat, dysphagia,  hemoptysis , cough, dyspnea, wheezing, chest pain, palpitations, orthopnea, edema, abdominal pain, nausea, melena, diarrhea, constipation, flank pain, dysuria, hematuria, urinary  Frequency, nocturia, numbness, tingling, seizures,  Focal weakness, Loss of consciousness,  Tremor, insomnia, depression, anxiety, and suicidal ideation.      Objective:  BP 128/72   Pulse 80   Temp 98.1 F (36.7 C) (Oral)   Ht 5\' 3"  (1.6 m)   Wt 138 lb  3.2 oz (62.7 kg)   LMP 04/26/2016 (Approximate) Comment: Just had spotting only  SpO2 98%   BMI 24.48 kg/m   BP Readings from Last 3 Encounters:  11/29/22 128/72  07/26/22 114/84  04/24/22 114/78    Wt Readings from Last 3 Encounters:  11/29/22 138 lb 3.2 oz (62.7 kg)  07/26/22 154 lb 6.4 oz (70 kg)  04/24/22 153 lb (69.4 kg)    Physical Exam  Lab Results  Component Value Date   HGBA1C 6.4 11/27/2022   HGBA1C 6.4 07/20/2022    Lab Results  Component Value Date   CREATININE 1.03 11/27/2022   CREATININE 1.02 07/20/2022   CREATININE 0.98 04/11/2018    Lab Results  Component Value Date   WBC 5.4 04/11/2018   HGB 12.7 04/11/2018   HCT 38.2 04/11/2018   PLT 228 04/11/2018   GLUCOSE 113 (H) 11/27/2022   CHOL 183 11/27/2022   TRIG 93 11/27/2022   HDL 67 11/27/2022   LDLCALC 97 11/27/2022   ALT 15 11/27/2022   AST 15 11/27/2022   NA 141 11/27/2022   K 4.5 11/27/2022   CL 103 11/27/2022   CREATININE 1.03 11/27/2022   BUN 22 11/27/2022   CO2 28 11/27/2022   TSH 1.56 07/20/2022   HGBA1C 6.4 11/27/2022    MM 3D SCREEN BREAST BILATERAL  Result Date: 08/31/2022 CLINICAL DATA:  Screening. EXAM: DIGITAL SCREENING BILATERAL MAMMOGRAM WITH TOMOSYNTHESIS AND CAD TECHNIQUE: Bilateral screening digital craniocaudal and mediolateral oblique mammograms were obtained. Bilateral screening digital breast tomosynthesis was performed. The images were evaluated with computer-aided detection. COMPARISON:  Previous exam(s). ACR Breast Density Category c: The breast tissue is heterogeneously dense, which may obscure small masses. FINDINGS: There are no findings suspicious for malignancy. IMPRESSION: No mammographic evidence of malignancy. A result letter of this screening mammogram will be mailed directly to the patient. RECOMMENDATION: Screening mammogram in one year. (Code:SM-B-01Y) BI-RADS CATEGORY  1: Negative. Electronically Signed   By: Fidela Salisbury M.D.   On: 08/31/2022 13:02     Assessment & Plan:  .There are no diagnoses linked to this encounter.   I provided 30 minutes of face-to-face time during this encounter reviewing patient's last visit with me, patient's  most recent visit with cardiology,  nephrology,  and neurology,  recent surgical and non surgical procedures, previous  labs and imaging studies, counseling on currently addressed issues,  and post visit ordering to diagnostics and therapeutics .   Follow-up: No follow-ups on file.   Crecencio Mc, MD

## 2022-11-30 NOTE — Assessment & Plan Note (Signed)
Remains well managed with Luvox, anafranil, buspirone and alprazolam (ordered by Psychiatry)

## 2022-11-30 NOTE — Assessment & Plan Note (Signed)
Patient's A1c  is elevated and  her fasting glucose is  suggestive of increased risk for  type 2 DM.  Lifestyle changes reviewed with patient today with regard to diet and exercise and commended   Lab Results  Component Value Date   HGBA1C 6.4 11/27/2022

## 2022-12-06 ENCOUNTER — Encounter: Payer: Self-pay | Admitting: Internal Medicine

## 2023-01-08 ENCOUNTER — Encounter: Payer: Self-pay | Admitting: Family Medicine

## 2023-01-08 ENCOUNTER — Ambulatory Visit: Payer: BC Managed Care – PPO | Admitting: Family Medicine

## 2023-01-08 VITALS — BP 112/74 | HR 91 | Temp 98.3°F | Ht 63.0 in | Wt 132.2 lb

## 2023-01-08 DIAGNOSIS — J029 Acute pharyngitis, unspecified: Secondary | ICD-10-CM

## 2023-01-08 DIAGNOSIS — J020 Streptococcal pharyngitis: Secondary | ICD-10-CM | POA: Insufficient documentation

## 2023-01-08 LAB — POCT INFLUENZA A/B
Influenza A, POC: NEGATIVE
Influenza B, POC: NEGATIVE

## 2023-01-08 LAB — POCT RAPID STREP A (OFFICE): Rapid Strep A Screen: POSITIVE — AB

## 2023-01-08 LAB — POC COVID19 BINAXNOW: SARS Coronavirus 2 Ag: NEGATIVE

## 2023-01-08 MED ORDER — PENICILLIN V POTASSIUM 500 MG PO TABS: 500.0000 mg | ORAL_TABLET | Freq: Three times a day (TID) | ORAL | 0 refills | Status: AC

## 2023-01-08 NOTE — Progress Notes (Signed)
  Tommi Rumps, MD Phone: [phone]  Natasha Kennedy is a 62 y.o. female who presents today for same-day visit.  Sore throat: Patient notes onset of sore throat yesterday.  She has had bodyaches and chills as well as headache.  She notes no postnasal drip, congestion, cough, fevers, or shortness of breath.  No known COVID or flu exposures.  She does note she was exposed to strep throat several weeks ago.  Social History   Tobacco Use  Smoking Status Never  Smokeless Tobacco Never    Current Outpatient Medications on File Prior to Visit  Medication Sig Dispense Refill   ALPRAZolam (XANAX) 0.25 MG tablet 1 p.o. every morning and 2 p.o. nightly 90 tablet 5   busPIRone (BUSPAR) 30 MG tablet Take 1 tablet (30 mg total) by mouth 2 (two) times daily. 60 tablet 11   clomiPRAMINE (ANAFRANIL) 50 MG capsule Take 1 capsule (50 mg total) by mouth at bedtime. 30 capsule 11   fluvoxaMINE (LUVOX) 100 MG tablet 2 po bid 120 tablet 11   Melatonin 3-10 MG TABS Take by mouth at bedtime as needed.     No current facility-administered medications on file prior to visit.     ROS see history of present illness  Objective  Physical Exam Vitals:   01/08/23 1511  BP: 112/74  Pulse: 91  Temp: 98.3 F (36.8 C)  SpO2: 99%    BP Readings from Last 3 Encounters:  01/08/23 112/74  11/29/22 128/72  07/26/22 114/84   Wt Readings from Last 3 Encounters:  01/08/23 132 lb 3.2 oz (60 kg)  11/29/22 138 lb 3.2 oz (62.7 kg)  07/26/22 154 lb 6.4 oz (70 kg)    Physical Exam Constitutional:      General: She is not in acute distress.    Appearance: She is not diaphoretic.  HENT:     Mouth/Throat:     Mouth: Mucous membranes are moist.     Pharynx: Posterior oropharyngeal erythema present. No oropharyngeal exudate.  Cardiovascular:     Rate and Rhythm: Normal rate and regular rhythm.     Heart sounds: Normal heart sounds.  Pulmonary:     Effort: Pulmonary effort is normal.     Breath sounds:  Normal breath sounds.  Skin:    General: Skin is warm and dry.  Neurological:     Mental Status: She is alert.      Assessment/Plan: Please see individual problem list.  Strep throat Assessment & Plan: Patient with positive rapid strep test today.  Symptoms and testing consistent with strep throat.  Will treat with penicillin VK 500 mg 3 times daily for 10 days.  Discussed that she has to be out of work until she has had at least 24 hours of antibiotics in her system.  Work note provided.  If she is not improving over the next 3 days she will let us know.  If she has any worsening symptoms she will be reevaluated.  Orders: -     Penicillin V Potassium; Take 1 tablet (500 mg total) by mouth 3 (three) times daily for 10 days.  Dispense: 30 tablet; Refill: 0  Sore throat -     POC COVID-19 BinaxNow -     POCT Influenza A/B -     POCT rapid strep A    Return if symptoms worsen or fail to improve.   Tommi Rumps, MD [facility]

## 2023-01-08 NOTE — Patient Instructions (Addendum)
Nice to see you. I sent in penicillin for you to start on.  If you are not improving over the next few days please let us know.  If you have any worsening symptoms please get reevaluated. You will need to remain out of work until you have at least 24 hours of antibiotics and you remain fever free for at least 24 hours.

## 2023-01-08 NOTE — Assessment & Plan Note (Signed)
Patient with positive rapid strep test today.  Symptoms and testing consistent with strep throat.  Will treat with penicillin VK 500 mg 3 times daily for 10 days.  Discussed that she has to be out of work until she has had at least 24 hours of antibiotics in her system.  Work note provided.  If she is not improving over the next 3 days she will let us know.  If she has any worsening symptoms she will be reevaluated.

## 2023-02-12 ENCOUNTER — Ambulatory Visit: Payer: BC Managed Care – PPO | Admitting: Family

## 2023-02-12 VITALS — BP 122/78 | HR 81 | Temp 98.7°F | Ht 63.0 in | Wt 128.2 lb

## 2023-02-12 DIAGNOSIS — J029 Acute pharyngitis, unspecified: Secondary | ICD-10-CM

## 2023-02-12 LAB — POCT RAPID STREP A (OFFICE): Rapid Strep A Screen: NEGATIVE

## 2023-02-12 LAB — POC COVID19 BINAXNOW: SARS Coronavirus 2 Ag: NEGATIVE

## 2023-02-12 LAB — POCT INFLUENZA A/B
Influenza A, POC: NEGATIVE
Influenza B, POC: NEGATIVE

## 2023-02-12 NOTE — Progress Notes (Signed)
Assessment & Plan:  Pharyngitis, unspecified etiology Assessment & Plan: Duration 3 days.  Patient nontoxic in appearance.  Point-of-care strep, COVID and flu are negative.  I obtained throat culture to ensure strep is not present.  Discussed more likely viral etiology based on duration of symptoms.  Encouraged salt water gargles, over-the-counter Chloraseptic spray.  She will let me know how she is doing and most certainly if symptoms persist or worsen she will let me know immediately so I can start antibiotic  Orders: -     Culture, Group A Strep  Sorethroat -     POC COVID-19 BinaxNow -     POCT rapid strep A -     POCT Influenza A/B     Return precautions given.   Risks, benefits, and alternatives of the medications and treatment plan prescribed today were discussed, and patient expressed understanding.   Education regarding symptom management and diagnosis given to patient on AVS either electronically or printed.  No follow-ups on file.  Rennie Plowman, FNP  Subjective:    Patient ID: Natasha Kennedy, female    DOB: Aug 04, 1961, 62 y.o.   MRN: [id number]  CC: Natasha Kennedy is a 62 y.o. female who presents today for an acute visit.    HPI: Complains of right sided sore throat, aches and chills x 3 days, unchanged.   No fever, cough, sob, cp, sinus pain, ear pain, nasal congestion, trouble swallowing , dysuria, abdominal pain  No h/o seasonal allergies H/o broken nose as a child Works at school  She has been taking Tylenol for discomfort      H/o CKD   Allergies: Patient has no known allergies. Current Outpatient Medications on File Prior to Visit  Medication Sig Dispense Refill   ALPRAZolam (XANAX) 0.25 MG tablet 1 p.o. every morning and 2 p.o. nightly 90 tablet 5   busPIRone (BUSPAR) 30 MG tablet Take 1 tablet (30 mg total) by mouth 2 (two) times daily. 60 tablet 11   clomiPRAMINE (ANAFRANIL) 50 MG capsule Take 1 capsule (50 mg total) by mouth at bedtime. 30  capsule 11   fluvoxaMINE (LUVOX) 100 MG tablet 2 po bid 120 tablet 11   Melatonin 3-10 MG TABS Take by mouth at bedtime as needed.     No current facility-administered medications on file prior to visit.    Review of Systems  Constitutional:  Negative for chills and fever.  HENT:  Positive for sore throat. Negative for congestion, postnasal drip, sinus pain and trouble swallowing.   Respiratory:  Negative for cough and shortness of breath.   Cardiovascular:  Negative for chest pain and palpitations.  Gastrointestinal:  Negative for nausea and vomiting.      Objective:    BP 122/78   Pulse 81   Temp 98.7 F (37.1 C) (Oral)   Ht 5\' 3"  (1.6 m)   Wt 128 lb 3.2 oz (58.2 kg)   LMP 04/26/2016 (Approximate) Comment: Just had spotting only  SpO2 99%   BMI 22.71 kg/m   BP Readings from Last 3 Encounters:  02/12/23 122/78  01/08/23 112/74  11/29/22 128/72   Wt Readings from Last 3 Encounters:  02/12/23 128 lb 3.2 oz (58.2 kg)  01/08/23 132 lb 3.2 oz (60 kg)  11/29/22 138 lb 3.2 oz (62.7 kg)    Physical Exam Vitals reviewed.  Constitutional:      Appearance: She is well-developed.  HENT:     Head: Normocephalic and atraumatic.     Right Ear:  Hearing, tympanic membrane, ear canal and external ear normal. No decreased hearing noted. No drainage, swelling or tenderness. No middle ear effusion. No foreign body. Tympanic membrane is not erythematous or bulging.     Left Ear: Hearing, tympanic membrane, ear canal and external ear normal. No decreased hearing noted. No drainage, swelling or tenderness.  No middle ear effusion. No foreign body. Tympanic membrane is not erythematous or bulging.     Nose: Nose normal. No rhinorrhea.     Right Sinus: No maxillary sinus tenderness or frontal sinus tenderness.     Left Sinus: No maxillary sinus tenderness or frontal sinus tenderness.     Mouth/Throat:     Pharynx: Uvula midline. Posterior oropharyngeal erythema present. No oropharyngeal  exudate.     Tonsils: No tonsillar abscesses.     Comments: Tonsils bilaterally behind pillars Eyes:     Conjunctiva/sclera: Conjunctivae normal.  Cardiovascular:     Rate and Rhythm: Regular rhythm.     Pulses: Normal pulses.     Heart sounds: Normal heart sounds.  Pulmonary:     Effort: Pulmonary effort is normal.     Breath sounds: Normal breath sounds. No wheezing, rhonchi or rales.  Lymphadenopathy:     Head:     Right side of head: No submental, submandibular, tonsillar, preauricular, posterior auricular or occipital adenopathy.     Left side of head: No submental, submandibular, tonsillar, preauricular, posterior auricular or occipital adenopathy.     Cervical: No cervical adenopathy.  Skin:    General: Skin is warm and dry.  Neurological:     Mental Status: She is alert.  Psychiatric:        Speech: Speech normal.        Behavior: Behavior normal.        Thought Content: Thought content normal.

## 2023-02-12 NOTE — Patient Instructions (Signed)
As discussed take time is likely viral pharyngitis (sore throat).  Salt water gargles, Chloraseptic spray for the discomfort.  You may continue Tylenol.  We will await strep culture however if in this interim you feel worse or symptoms were to persist, please let me know right away so I can send an antibiotic.

## 2023-02-12 NOTE — Assessment & Plan Note (Signed)
Duration 3 days.  Patient nontoxic in appearance.  Point-of-care strep, COVID and flu are negative.  I obtained throat culture to ensure strep is not present.  Discussed more likely viral etiology based on duration of symptoms.  Encouraged salt water gargles, over-the-counter Chloraseptic spray.  She will let me know how she is doing and most certainly if symptoms persist or worsen she will let me know immediately so I can start antibiotic

## 2023-02-14 ENCOUNTER — Other Ambulatory Visit: Payer: Self-pay | Admitting: Family

## 2023-02-14 DIAGNOSIS — J020 Streptococcal pharyngitis: Secondary | ICD-10-CM

## 2023-02-14 LAB — CULTURE, GROUP A STREP
MICRO NUMBER:: 14825156
SPECIMEN QUALITY:: ADEQUATE

## 2023-02-14 MED ORDER — PENICILLIN V POTASSIUM 500 MG PO TABS: 500.0000 mg | ORAL_TABLET | Freq: Two times a day (BID) | ORAL | 0 refills | Status: AC

## 2023-02-20 ENCOUNTER — Ambulatory Visit: Payer: BC Managed Care – PPO | Admitting: Dermatology

## 2023-02-20 VITALS — BP 133/87

## 2023-02-20 DIAGNOSIS — J020 Streptococcal pharyngitis: Secondary | ICD-10-CM

## 2023-02-20 DIAGNOSIS — Z79899 Other long term (current) drug therapy: Secondary | ICD-10-CM | POA: Diagnosis not present

## 2023-02-20 DIAGNOSIS — L409 Psoriasis, unspecified: Secondary | ICD-10-CM | POA: Diagnosis not present

## 2023-02-20 MED ORDER — TRIAMCINOLONE ACETONIDE 0.1 % EX CREA: 1.0000 | TOPICAL_CREAM | Freq: Two times a day (BID) | CUTANEOUS | 1 refills | Status: AC

## 2023-02-20 NOTE — Progress Notes (Signed)
   New Patient Visit   Subjective  Natasha Kennedy is a 62 y.o. female who presents for the following: Rash all over - she had strep throat in March and April - she started breaking out about 3 weeks ago.  The following portions of the chart were reviewed this encounter and updated as appropriate: medications, allergies, medical history  Review of Systems:  No other skin or systemic complaints except as noted in HPI or Assessment and Plan.  Objective  Well appearing patient in no apparent distress; mood and affect are within normal limits. A focused examination was performed of the following areas: trunk, arms Relevant exam findings are noted in the Assessment and Plan.   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 It is associated with an increased risk of inflammatory bowel disease, heart disease, non-alcoholic fatty liver disease, and depression.  Treatments include light and laser treatments; topical medications; and systemic medications including oral and injectables.  Treatment Plan: Start TMC 0.1% cream bid to affected areas - avoid face, groin, underarms. Discussed NBUVB light treatments 2 times per week. Consider more aggressive treatment if needed in future.     Return if symptoms worsen or fail to improve.  I, Joanie Coddington, CMA, am acting as scribe for Armida Sans, MD . Documentation: I have reviewed the above documentation for accuracy and completeness, and I agree with the above.  Armida Sans, MD

## 2023-02-20 NOTE — Patient Instructions (Signed)
Topical steroids (such as triamcinolone, fluocinolone, fluocinonide, mometasone, clobetasol, halobetasol, betamethasone, hydrocortisone) can cause thinning and lightening of the skin if they are used for too long in the same area. Your physician has selected the right strength medicine for your problem and area affected on the body. Please use your medication only as directed by your physician to prevent side effects.    Due to recent changes in healthcare laws, you may see results of your pathology and/or laboratory studies on MyChart before the doctors have had a chance to review them. We understand that in some cases there may be results that are confusing or concerning to you. Please understand that not all results are received at the same time and often the doctors may need to interpret multiple results in order to provide you with the best plan of care or course of treatment. Therefore, we ask that you please give us 2 business days to thoroughly review all your results before contacting the office for clarification. Should we see a critical lab result, you will be contacted sooner.   If You Need Anything After Your Visit  If you have any questions or concerns for your doctor, please call our main line at [phone] and press option 4 to reach your doctor's medical assistant. If no one answers, please leave a voicemail as directed and we will return your call as soon as possible. Messages left after 4 pm will be answered the following business day.   You may also send us a message via MyChart. We typically respond to MyChart messages within 1-2 business days.  For prescription refills, please ask your pharmacy to contact our office. Our fax number is [phone].  If you have an urgent issue when the clinic is closed that cannot wait until the next business day, you can page your doctor at the number below.    Please note that while we do our best to be available for urgent issues outside of  office hours, we are not available 24/7.   If you have an urgent issue and are unable to reach us, you may choose to seek medical care at your doctor's office, retail clinic, urgent care center, or emergency room.  If you have a medical emergency, please immediately call 911 or go to the emergency department.  Pager Numbers  - Dr. Kowalski: [phone]  - Dr. Moye: [phone]  - Dr. Stewart: [phone]  In the event of inclement weather, please call our main line at [phone] for an update on the status of any delays or closures.  Dermatology Medication Tips: Please keep the boxes that topical medications come in in order to help keep track of the instructions about where and how to use these. Pharmacies typically print the medication instructions only on the boxes and not directly on the medication tubes.   If your medication is too expensive, please contact our office at [phone] option 4 or send us a message through MyChart.   We are unable to tell what your co-pay for medications will be in advance as this is different depending on your insurance coverage. However, we may be able to find a substitute medication at lower cost or fill out paperwork to get insurance to cover a needed medication.   If a prior authorization is required to get your medication covered by your insurance company, please allow us 1-2 business days to complete this process.  Drug prices often vary depending on where the prescription is filled and some pharmacies   may offer cheaper prices.  The website www.goodrx.com contains coupons for medications through different pharmacies. The prices here do not account for what the cost may be with help from insurance (it may be cheaper with your insurance), but the website can give you the price if you did not use any insurance.  - You can print the associated coupon and take it with your prescription to the pharmacy.  - You may also stop by our office during  regular business hours and pick up a GoodRx coupon card.  - If you need your prescription sent electronically to a different pharmacy, notify our office through [facility] MyChart or by phone at [phone] option 4.     Si Usted Necesita Algo Despus de Su Visita  Tambin puede enviarnos un mensaje a travs de MyChart. Por lo general respondemos a los mensajes de MyChart en el transcurso de 1 a 2 das hbiles.  Para renovar recetas, por favor pida a su farmacia que se ponga en contacto con nuestra oficina. Nuestro nmero de fax es el [phone].  Si tiene un asunto urgente cuando la clnica est cerrada y que no puede esperar hasta el siguiente da hbil, puede llamar/localizar a su doctor(a) al nmero que aparece a continuacin.   Por favor, tenga en cuenta que aunque hacemos todo lo posible para estar disponibles para asuntos urgentes fuera del horario de oficina, no estamos disponibles las 24 horas del da, los 7 das de la semana.   Si tiene un problema urgente y no puede comunicarse con nosotros, puede optar por buscar atencin mdica  en el consultorio de su doctor(a), en una clnica privada, en un centro de atencin urgente o en una sala de emergencias.  Si tiene una emergencia mdica, por favor llame inmediatamente al 911 o vaya a la sala de emergencias.  Nmeros de bper  - Dr. Kowalski: [phone]  - Dra. Moye: [phone]  - Dra. Stewart: [phone]  En caso de inclemencias del tiempo, por favor llame a nuestra lnea principal al [phone] para una actualizacin sobre el estado de cualquier retraso o cierre.  Consejos para la medicacin en dermatologa: Por favor, guarde las cajas en las que vienen los medicamentos de uso tpico para ayudarle a seguir las instrucciones sobre dnde y cmo usarlos. Las farmacias generalmente imprimen las instrucciones del medicamento slo en las cajas y no directamente en los tubos del medicamento.   Si su medicamento es muy  caro, por favor, pngase en contacto con nuestra oficina llamando al [phone] y presione la opcin 4 o envenos un mensaje a travs de MyChart.   No podemos decirle cul ser su copago por los medicamentos por adelantado ya que esto es diferente dependiendo de la cobertura de su seguro. Sin embargo, es posible que podamos encontrar un medicamento sustituto a menor costo o llenar un formulario para que el seguro cubra el medicamento que se considera necesario.   Si se requiere una autorizacin previa para que su compaa de seguros cubra su medicamento, por favor permtanos de 1 a 2 das hbiles para completar este proceso.  Los precios de los medicamentos varan con frecuencia dependiendo del lugar de dnde se surte la receta y alguna farmacias pueden ofrecer precios ms baratos.  El sitio web www.goodrx.com tiene cupones para medicamentos de diferentes farmacias. Los precios aqu no tienen en cuenta lo que podra costar con la ayuda del seguro (puede ser ms barato con su seguro), pero el sitio web puede darle el precio si no   utiliz ningn seguro.  - Puede imprimir el cupn correspondiente y llevarlo con su receta a la farmacia.  - Tambin puede pasar por nuestra oficina durante el horario de atencin regular y recoger una tarjeta de cupones de GoodRx.  - Si necesita que su receta se enve electrnicamente a una farmacia diferente, informe a nuestra oficina a travs de MyChart de [place] o por telfono llamando al [phone] y presione la opcin 4.  

## 2023-02-27 ENCOUNTER — Encounter: Payer: Self-pay | Admitting: Dermatology

## 2023-03-13 ENCOUNTER — Telehealth: Payer: Self-pay | Admitting: Advanced Practice Midwife

## 2023-03-13 NOTE — Telephone Encounter (Signed)
Left message for patient to call office back to schedule annual appt 

## 2023-04-23 ENCOUNTER — Telehealth: Payer: BC Managed Care – PPO | Admitting: Physician Assistant

## 2023-04-23 ENCOUNTER — Encounter: Payer: Self-pay | Admitting: Physician Assistant

## 2023-04-23 DIAGNOSIS — F422 Mixed obsessional thoughts and acts: Secondary | ICD-10-CM | POA: Diagnosis not present

## 2023-04-23 DIAGNOSIS — F411 Generalized anxiety disorder: Secondary | ICD-10-CM | POA: Diagnosis not present

## 2023-04-23 DIAGNOSIS — F3342 Major depressive disorder, recurrent, in full remission: Secondary | ICD-10-CM | POA: Diagnosis not present

## 2023-04-23 MED ORDER — CLOMIPRAMINE HCL 50 MG PO CAPS: 50.0000 mg | ORAL_CAPSULE | Freq: Every day | ORAL | 11 refills | Status: DC

## 2023-04-23 MED ORDER — ALPRAZOLAM 0.25 MG PO TABS: ORAL_TABLET | ORAL | 5 refills | Status: DC

## 2023-04-23 NOTE — Progress Notes (Signed)
[facility]  Patient ID: Natasha Kennedy,  MRN: [id number]  PCP: Sherlene Shams, MD  Date of Evaluation: 04/23/2023 Time spent: 19 minutes  Chief Complaint:  Chief Complaint   Anxiety; Insomnia; Follow-up    Virtual Visit via Telehealth  I connected with patient by a video enabled telemedicine application with their informed consent, and verified patient privacy and that I am speaking with the correct person using two identifiers.  I am private, in my office and the patient is at home.  I discussed the limitations, risks, security and privacy concerns of performing an evaluation and management service by video and the availability of in person appointments. I also discussed with the patient that there may be a patient responsible charge related to this service. The patient expressed understanding and agreed to proceed.   I discussed the assessment and treatment plan with the patient. The patient was provided an opportunity to ask questions and all were answered. The patient agreed with the plan and demonstrated an understanding of the instructions.   The patient was advised to call back or seek an in-person evaluation if the symptoms worsen or if the condition fails to improve as anticipated.  I provided 19 minutes of non-face-to-face time during this encounter.  HISTORY/CURRENT STATUS: HPI For routine med check.  Natasha Kennedy has had a good 6 months.  She has started exercising more regularly which has been very helpful.  Even during the winter when she usually has more depression, she felt better than she normally does.  She make sure she gets out of the house once a day, just to run errands or what ever is needed.  That helps her get out of the house.  Patient is able to enjoy things.  Energy and motivation are good.  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  Enjoying her time now.  No extreme sadness, tearfulness, or feelings of hopelessness.  Sleeps well. ADLs and personal  hygiene are normal.   Denies any changes in concentration, making decisions, or remembering things.  Appetite has not changed.  Weight is stable. Denies suicidal or homicidal thoughts.  Does have anxiety, can have obsessions, ruminating thoughts at times but the Luvox and clomipramine have really helped that.  Takes the Xanax as needed, needs it every night to help her relax to go to sleep, plus she takes 1 every morning to help with the anxiety as a preventative.  The BuSpar also helps too.  Patient denies increased energy with decreased need for sleep, increased talkativeness, racing thoughts, impulsivity or risky behaviors, increased spending, increased libido, grandiosity, increased irritability or anger, paranoia, or hallucinations.  Denies dizziness, syncope, seizures, numbness, tingling, tremor, tics, unsteady gait, slurred speech, confusion. Denies muscle or joint pain, stiffness, or dystonia.  Individual Medical History/ Review of Systems: Changes? :No     Past medications for mental health diagnoses include: Luvox, BuSpar, Xanax, Seroquel, Abilify caused nausea and vomiting, Effexor, clomipramine  Allergies: Patient has no known allergies.  Current Medications:  Current Outpatient Medications:    busPIRone (BUSPAR) 30 MG tablet, Take 1 tablet (30 mg total) by mouth 2 (two) times daily., Disp: 60 tablet, Rfl: 11   fluvoxaMINE (LUVOX) 100 MG tablet, 2 po bid, Disp: 120 tablet, Rfl: 11   Melatonin 3-10 MG TABS, Take by mouth at bedtime as needed., Disp: , Rfl:    triamcinolone cream (KENALOG) 0.1 %, Apply 1 Application topically 2 (two) times daily. Avoid face, groin, underarms., Disp: 453  g, Rfl: 1   ALPRAZolam (XANAX) 0.25 MG tablet, 1 p.o. every morning and 2 p.o. nightly, Disp: 90 tablet, Rfl: 5   clomiPRAMINE (ANAFRANIL) 50 MG capsule, Take 1 capsule (50 mg total) by mouth at bedtime., Disp: 30 capsule, Rfl: 11 Medication Side Effects: none  Family Medical/ Social History:  Changes?  no  MENTAL HEALTH EXAM:  Last menstrual period 04/26/2016.There is no height or weight on file to calculate BMI.  General Appearance: Casual, Neat and Well Groomed  Eye Contact:  Good  Speech:  Clear and Coherent and Normal Rate  Volume:  Normal  Mood:  Euthymic  Affect:  Congruent  Thought Process:  Goal Directed and Descriptions of Associations: Circumstantial  Orientation:  Full (Time, Place, and Person)  Thought Content: Logical   Suicidal Thoughts:  No  Homicidal Thoughts:  No  Memory:  WNL  Judgement:  Good  Insight:  Good  Psychomotor Activity:  Normal  Concentration:  Concentration: Good and Attention Span: Good  Recall:  Good  Fund of Knowledge: Good  Language: Good  Assets:  Communication Skills Desire for Improvement Financial Resources/Insurance Housing Resilience Social Support Transportation Vocational/Educational  ADL's:  Intact  Cognition: WNL  Prognosis:  Good   Labs from 11/27/2022 reviewed in chart.  DIAGNOSES:    ICD-10-CM   1. Mixed obsessional thoughts and acts  F42.2     2. Generalized anxiety disorder  F41.1     3. Major depression, recurrent, full remission (HCC)  F33.42      Receiving Psychotherapy: No   RECOMMENDATIONS:  PDMP was reviewed.  Last Xanax filled 04/12/2023. I provided 19 minutes of face to face time during this encounter, including time spent before and after the visit in records review, medical decision making, counseling pertinent to today's visit, and charting.   I am glad to see her doing so well.  No changes in meds are needed.  Continue Xanax 0.25 mg 1 p.o. every morning and 2 p.o. nightly routinely.   Continue BuSpar 30 mg, 1 p.o. twice daily. Continue clomipramine 50 mg, 1 p.o. nightly. Continue Luvox 100 mg, 2 p.o. twice daily. Recommend restarting vitamins.   Return in 6 months.  [name], PA-C

## 2023-05-12 ENCOUNTER — Other Ambulatory Visit: Payer: Self-pay | Admitting: Physician Assistant

## 2023-05-28 NOTE — Telephone Encounter (Signed)
As of 05/28/2023, pt had not contacted office to schedule annual exam.

## 2023-08-21 ENCOUNTER — Other Ambulatory Visit (INDEPENDENT_AMBULATORY_CARE_PROVIDER_SITE_OTHER): Payer: BC Managed Care – PPO

## 2023-08-21 DIAGNOSIS — R7303 Prediabetes: Secondary | ICD-10-CM | POA: Diagnosis not present

## 2023-08-21 LAB — COMPREHENSIVE METABOLIC PANEL
ALT: 14 U/L (ref 0–35)
AST: 16 U/L (ref 0–37)
Albumin: 4.3 g/dL (ref 3.5–5.2)
Alkaline Phosphatase: 51 U/L (ref 39–117)
BUN: 23 mg/dL (ref 6–23)
CO2: 29 meq/L (ref 19–32)
Calcium: 10.3 mg/dL (ref 8.4–10.5)
Chloride: 102 meq/L (ref 96–112)
Creatinine, Ser: 0.91 mg/dL (ref 0.40–1.20)
GFR: 68.89 mL/min (ref >60.00)
Glucose, Bld: 92 mg/dL (ref 70–99)
Potassium: 3.8 meq/L (ref 3.5–5.1)
Sodium: 138 meq/L (ref 135–145)
Total Bilirubin: 0.4 mg/dL (ref 0.2–1.2)
Total Protein: 7.6 g/dL (ref 6.0–8.3)

## 2023-08-21 LAB — HEMOGLOBIN A1C: Hgb A1c MFr Bld: 6.1 % (ref 4.6–6.5)

## 2023-08-24 ENCOUNTER — Encounter: Payer: Self-pay | Admitting: Internal Medicine

## 2023-08-24 ENCOUNTER — Ambulatory Visit: Payer: BC Managed Care – PPO | Admitting: Internal Medicine

## 2023-08-24 VITALS — BP 126/80 | HR 73 | Temp 97.4°F | Ht 63.0 in | Wt 132.0 lb

## 2023-08-24 DIAGNOSIS — Z9189 Other specified personal risk factors, not elsewhere classified: Secondary | ICD-10-CM

## 2023-08-24 DIAGNOSIS — Z1211 Encounter for screening for malignant neoplasm of colon: Secondary | ICD-10-CM

## 2023-08-24 DIAGNOSIS — R7303 Prediabetes: Secondary | ICD-10-CM | POA: Diagnosis not present

## 2023-08-24 DIAGNOSIS — Z0000 Encounter for general adult medical examination without abnormal findings: Secondary | ICD-10-CM | POA: Diagnosis not present

## 2023-08-24 DIAGNOSIS — Z23 Encounter for immunization: Secondary | ICD-10-CM | POA: Diagnosis not present

## 2023-08-24 DIAGNOSIS — Z1231 Encounter for screening mammogram for malignant neoplasm of breast: Secondary | ICD-10-CM

## 2023-08-24 IMAGING — MG MM DIGITAL SCREENING BILAT W/ TOMO AND CAD
8 series · 9 of 24 positions shown · non-contrast
Comparison: Previous exam(s).

CLINICAL DATA: Screening.

EXAM:
DIGITAL SCREENING BILATERAL MAMMOGRAM WITH TOMOSYNTHESIS AND CAD
TECHNIQUE: Bilateral screening digital craniocaudal and mediolateral oblique
mammograms were obtained. Bilateral screening digital breast
tomosynthesis was performed. The images were evaluated with
computer-aided detection.

[L MLO synth-2D]
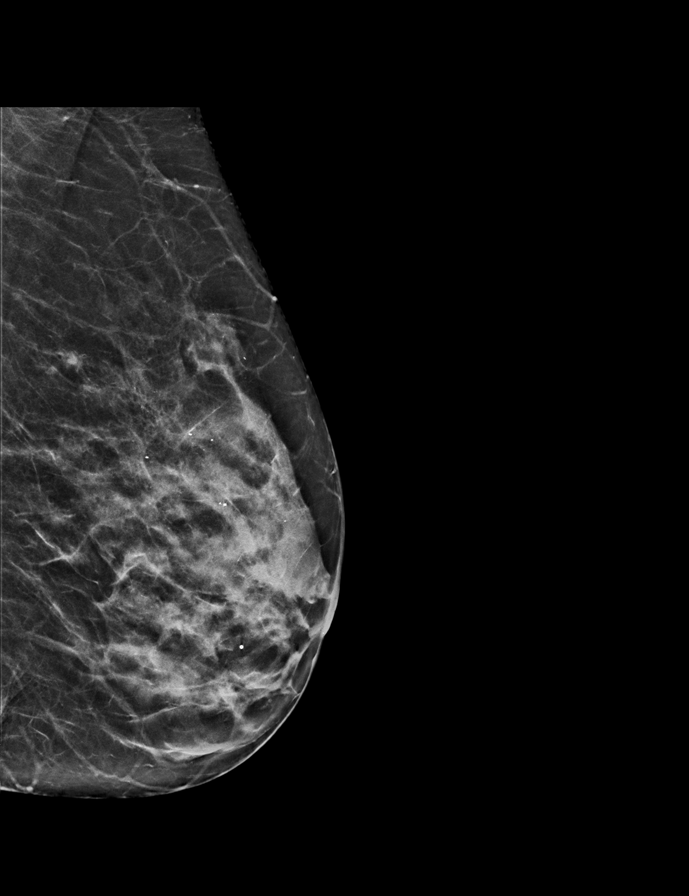

[R MLO synth-2D]
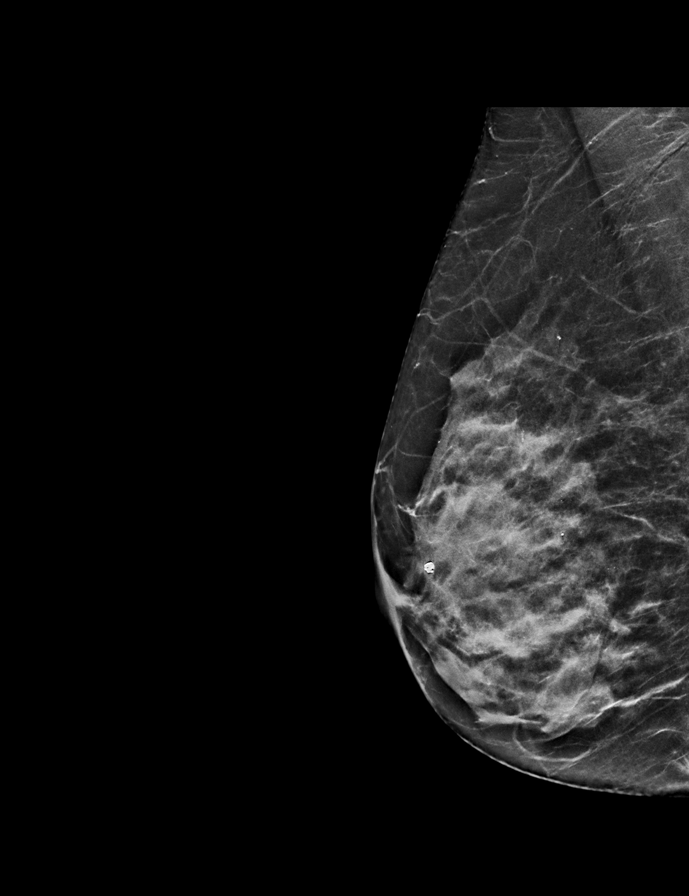

[R CC synth-2D]
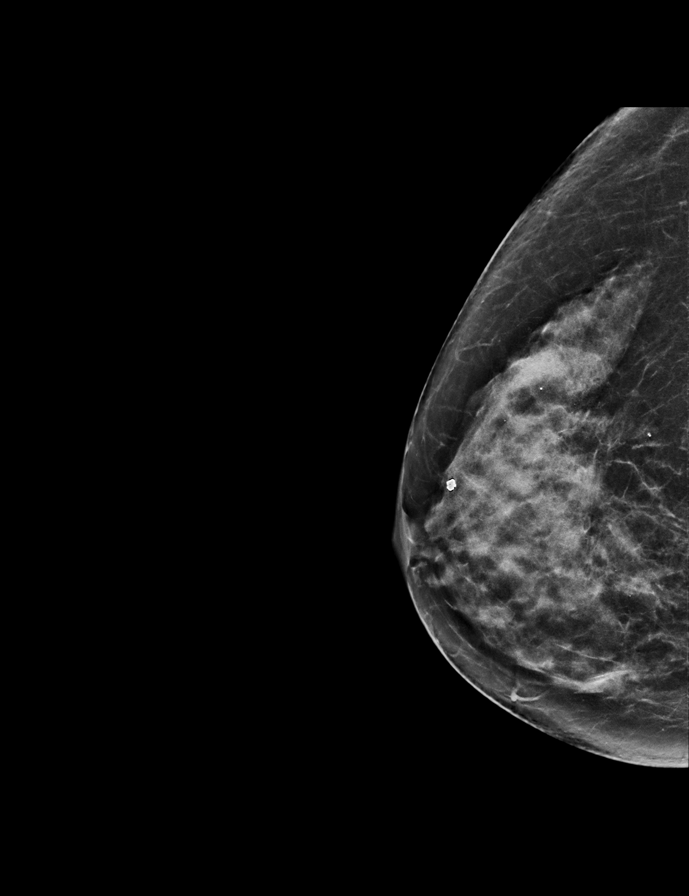

[L CC synth-2D]
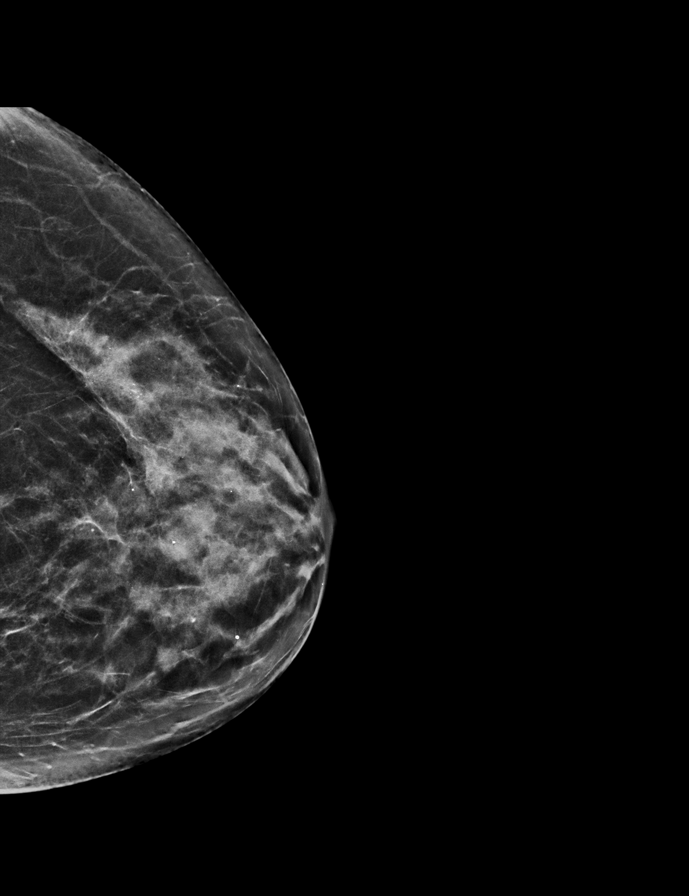

[L CC tomo · 2 of 57 frames shown]
[frame 19/57]
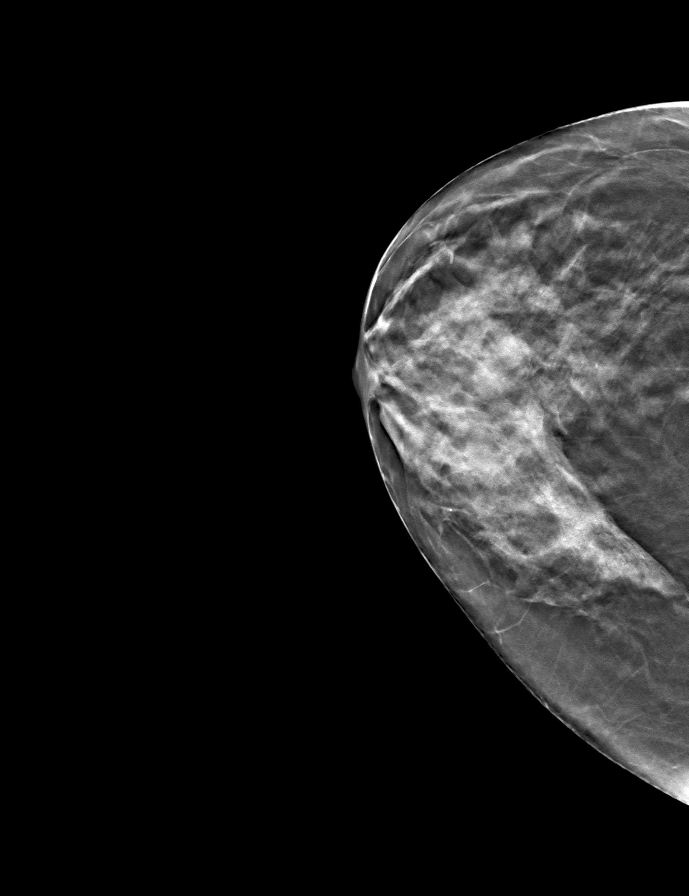
[frame 29/57]
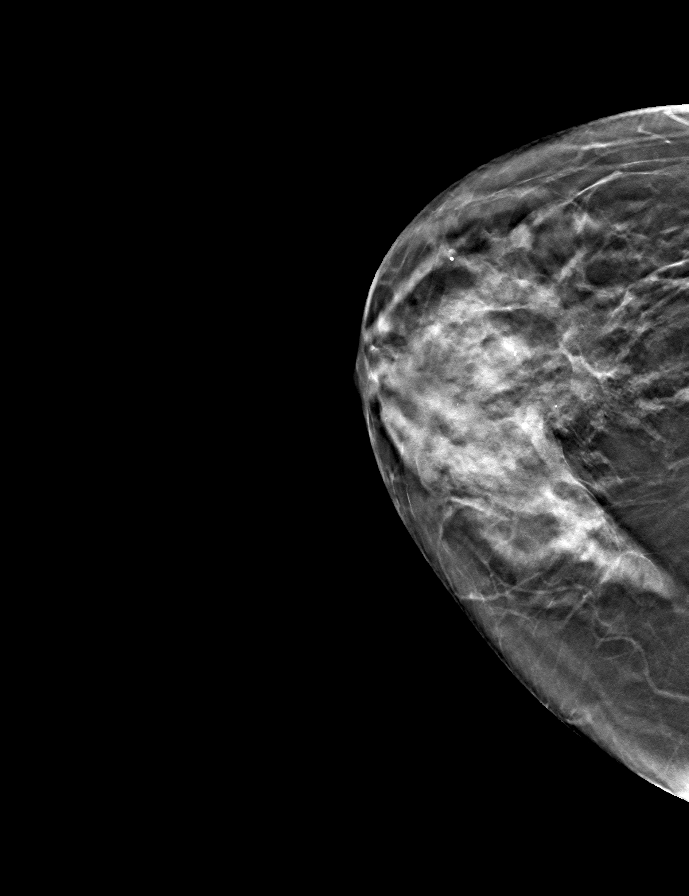

[R CC tomo · tomo slice 31/60.0]
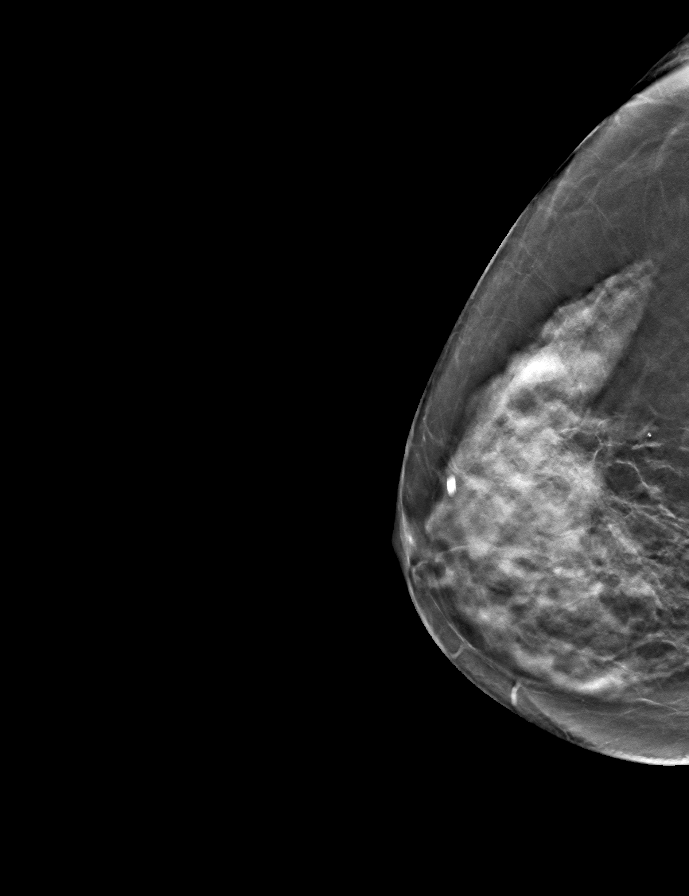

[R MLO tomo · tomo slice 28/55.0]
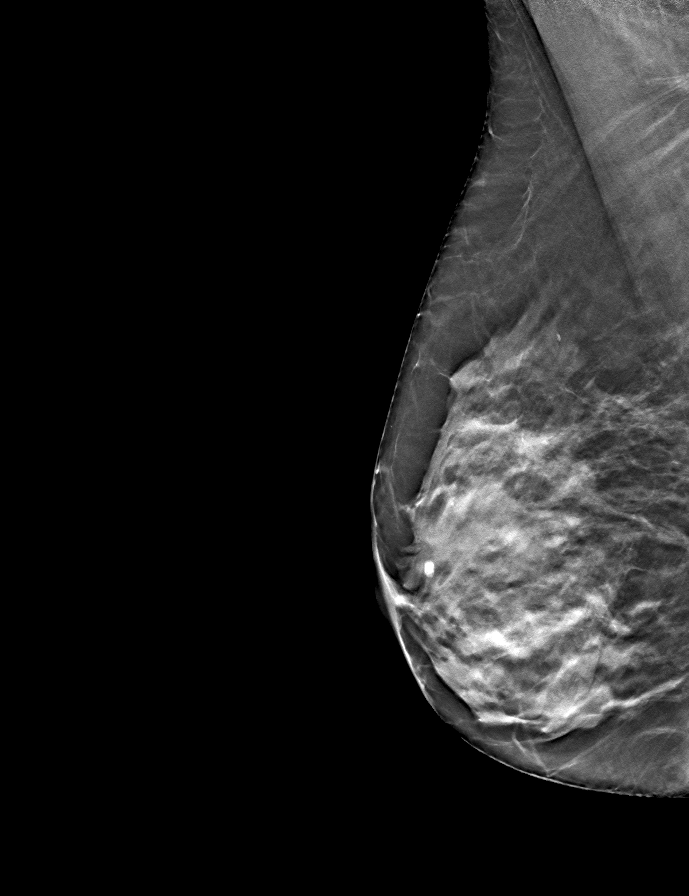

[L MLO tomo · tomo slice 27/53.0]
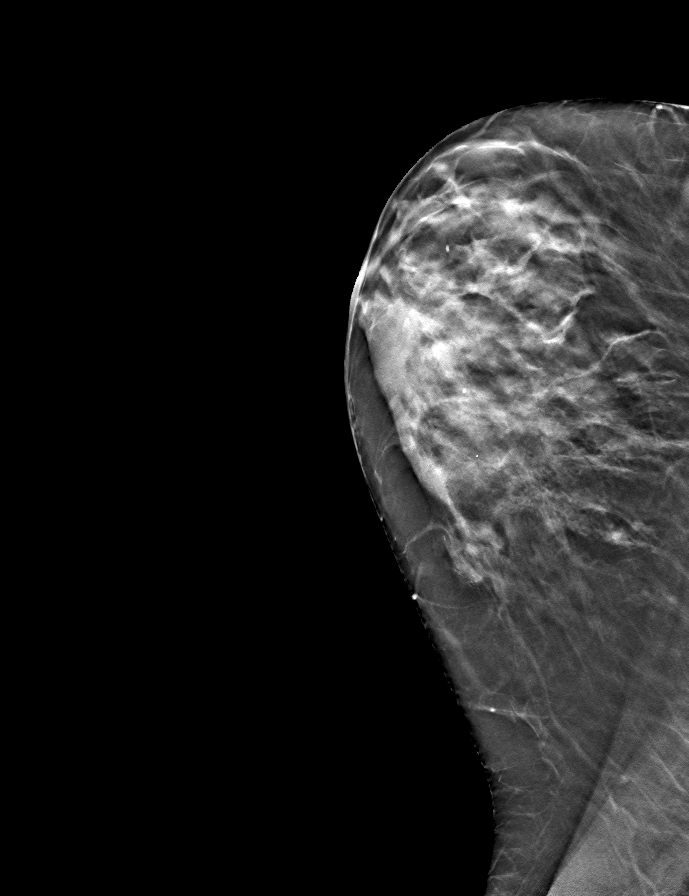

[9 of 24 positions shown; findings below may reference images not displayed]

ACR Breast Density Category c: The breast tissue is heterogeneously
dense, which may obscure small masses.
FINDINGS: There are no findings suspicious for malignancy.
IMPRESSION: No mammographic evidence of malignancy. A result letter of this
screening mammogram will be mailed directly to the patient.

RECOMMENDATION:
Screening mammogram in one year. (Code:[id number])

BI-RADS CATEGORY  1: Negative.

## 2023-08-24 NOTE — Assessment & Plan Note (Signed)
ABBREVIATED BREAST MRI DISCUSSED, PATIENT IS INTERESTED IF INSURANCE WILL PAY

## 2023-08-24 NOTE — Patient Instructions (Addendum)
WELL DONE!    THANK YOU FOR TAKING OUR HEALTH SERIOUSLY!!!!   You may have a light sprain of an ankle ligament . Ok to walk on treadmill unless the  pain is intensified   Ice for 15 minutes at a time with foot elevated  600 to 800 mg ibuprofen every 8 to 12 hours   You can add up to 2000 mg of acetominophen (tylenol) every day safely  In divided doses (500 mg every 6 hours  Or 1000 mg every 12 hours.)   I will initiate the order for your colon cancer screening  Test.  It is called  Cologuard.  It will be delivered to your house, and you will send off a stool sample in the envelope it provides.   Breast cancer screening now offers an MRI to women at higher risk.  I have ordered it .  If your insurance will not cover,  than proceed with your  annual mammogram which has also been ordered .  [facility] will not allow [place] to schedule it for you,  so please  call to make your appointment [phone]    Consider getting the Shingles vaccine ("shingrix")

## 2023-08-24 NOTE — Progress Notes (Unsigned)
Patient ID: Natasha Kennedy, female    DOB: [birth year]  Age: 62 y.o. MRN: [id number]  The patient is here for annual preventive examination and management of other chronic and acute problems.   The risk factors are reflected in the social history.   The roster of all physicians providing medical care to patient - is listed in the Snapshot section of the chart.   Activities of daily living:  The patient is 100% independent in all ADLs: dressing, toileting, feeding as well as independent mobility   Home safety : The patient has smoke detectors in the home. They wear seatbelts.  There are no unsecured firearms at home. There is no violence in the home.    There is no risks for hepatitis, STDs or HIV. There is no   history of blood transfusion. They have no travel history to infectious disease endemic areas of the world.   The patient has seen their dentist in the last six month. They have seen their eye doctor in the last year. The patinet  denies slight hearing difficulty with regard to whispered voices and some television programs.  They have deferred audiologic testing in the last year.  They do not  have excessive sun exposure. Discussed the need for sun protection: hats, long sleeves and use of sunscreen if there is significant sun exposure.    Diet: the importance of a healthy diet is discussed. They do have a healthy diet.   The benefits of regular aerobic exercise were discussed. The patient  exercises  3 to 5 days per week  for  60 minutes.    Depression screen: there are no signs or vegative symptoms of depression- irritability, change in appetite, anhedonia, sadness/tearfullness.   The following portions of the patient's history were reviewed and updated as appropriate: allergies, current medications, past family history, past medical history,  past surgical history, past social history  and problem list.   Visual acuity was not assessed per patient preference since the patient has regular  follow up with an  ophthalmologist. Hearing and body mass index were assessed and reviewed.    During the course of the visit the patient was educated and counseled about appropriate screening and preventive services including : fall prevention , diabetes screening, nutrition counseling, colorectal cancer screening, and recommended immunizations.    Chief Complaint:   1) She continues to have regular follow up for GAD/depression with mixed obsessional thoughts, anxiety, etc.  Taking luvox and clomipramine, busparr   2) she has lost 25 lbs intentionally since last year's CPE and has dropped her aqc from 6.4 to 6.1   Review of Symptoms  Patient denies headache, fevers, malaise, unintentional weight loss, skin rash, eye pain, sinus congestion and sinus pain, sore throat, dysphagia,  hemoptysis , cough, dyspnea, wheezing, chest pain, palpitations, orthopnea, edema, abdominal pain, nausea, melena, diarrhea, constipation, flank pain, dysuria, hematuria, urinary  Frequency, nocturia, numbness, tingling, seizures,  Focal weakness, Loss of consciousness,  Tremor, insomnia, depression, anxiety, and suicidal ideation.    Physical Exam:  BP 126/80   Pulse 73   Temp (!) 97.4 F (36.3 C)   Ht 5\' 3"  (1.6 m)   Wt 132 lb (59.9 kg)   LMP 04/26/2016 (Approximate) Comment: Just had spotting only  SpO2 97%   BMI 23.38 kg/m    Physical Exam Vitals reviewed.  Constitutional:      General: She is not in acute distress.    Appearance: Normal appearance. She is normal weight. She  is not ill-appearing, toxic-appearing or diaphoretic.  HENT:     Head: Normocephalic.  Eyes:     General: No scleral icterus.       Right eye: No discharge.        Left eye: No discharge.     Conjunctiva/sclera: Conjunctivae normal.  Cardiovascular:     Rate and Rhythm: Normal rate and regular rhythm.     Heart sounds: Normal heart sounds.  Pulmonary:     Effort: Pulmonary effort is normal. No respiratory distress.      Breath sounds: Normal breath sounds.  Musculoskeletal:        General: Normal range of motion.  Skin:    General: Skin is warm and dry.  Neurological:     General: No focal deficit present.     Mental Status: She is alert and oriented to person, place, and time. Mental status is at baseline.  Psychiatric:        Mood and Affect: Mood normal.        Behavior: Behavior normal.        Thought Content: Thought content normal.        Judgment: Judgment normal.   Assessment and Plan: There are no diagnoses linked to this encounter.  No follow-ups on file.  Sherlene Shams, MD

## 2023-08-26 NOTE — Assessment & Plan Note (Signed)

## 2023-08-26 NOTE — Assessment & Plan Note (Signed)
She has lost weight and lowered her A1c with low GI diet and regular ezercise  Lab Results  Component Value Date   HGBA1C 6.1 08/21/2023

## 2023-08-29 ENCOUNTER — Telehealth: Payer: Self-pay | Admitting: Internal Medicine

## 2023-08-29 NOTE — Telephone Encounter (Signed)
Lft pt vm to follow up with her regarding oop cost from ins. thanks

## 2023-09-06 ENCOUNTER — Telehealth: Payer: Self-pay | Admitting: Internal Medicine

## 2023-09-06 NOTE — Telephone Encounter (Signed)
Lft pt vm to call ofc regarding the oop ins cost.thanks

## 2023-09-13 NOTE — Addendum Note (Signed)
Addended by: Sherlene Shams on: 09/13/2023 09:55 PM   Modules accepted: Orders

## 2023-09-28 LAB — COLOGUARD: COLOGUARD: NEGATIVE

## 2023-10-04 ENCOUNTER — Encounter: Payer: Self-pay | Admitting: Nurse Practitioner

## 2023-10-04 ENCOUNTER — Ambulatory Visit: Payer: BC Managed Care – PPO | Admitting: Nurse Practitioner

## 2023-10-04 VITALS — BP 126/74 | HR 68 | Temp 97.8°F | Ht 63.0 in | Wt 130.4 lb

## 2023-10-04 DIAGNOSIS — H1032 Unspecified acute conjunctivitis, left eye: Secondary | ICD-10-CM | POA: Diagnosis not present

## 2023-10-04 MED ORDER — MOXIFLOXACIN HCL 0.5 % OP SOLN: 1.0000 [drp] | Freq: Three times a day (TID) | OPHTHALMIC | 0 refills | Status: AC

## 2023-10-04 NOTE — Assessment & Plan Note (Signed)
Left conjunctiva injected, Will treat with Moxifloxacin ophthalmic drops three times a day in the left eye. Wipe the discharge with warm, wet  cotton or wash cloth. Do not apply eye makeup.

## 2023-10-04 NOTE — Patient Instructions (Signed)
 Bacterial Conjunctivitis, Adult  Bacterial conjunctivitis is an infection of your conjunctiva. This is the clear membrane that covers the white part of your eye and the inner part of your eyelid. This infection can make your eye:  Red or pink.  Itchy or irritated.  This condition spreads easily from person to person (is contagious) and from one eye to the other eye.  What are the causes?  This condition is caused by germs (bacteria). You may get the infection if you come into close contact with:  A person who has the infection.  Items that have germs on them (are contaminated), such as face towels, contact lens solution, or eye makeup.  What increases the risk?  You are more likely to get this condition if:  You have contact with people who have the infection.  You wear contact lenses.  You have a sinus infection.  You have had a recent eye injury or surgery.  You have a weak body defense system (immune system).  You have dry eyes.  What are the signs or symptoms?    Thick, yellowish discharge from the eye.  Tearing or watery eyes.  Itchy eyes.  Burning feeling in your eyes.  Eye redness.  Swollen eyelids.  Blurred vision.  How is this treated?    Antibiotic eye drops or ointment.  Antibiotic medicine taken by mouth. This is used for infections that do not get better with drops or ointment or that last more than 10 days.  Cool, wet cloths placed on the eyes.  Artificial tears used 2-6 times a day.  Follow these instructions at home:  Medicines  Take or apply your antibiotic medicine as told by your doctor. Do not stop using it even if you start to feel better.  Take or apply over-the-counter and prescription medicines only as told by your doctor.  Do not touch your eyelid with the eye-drop bottle or the ointment tube.  Managing discomfort  Wipe any fluid from your eye with a warm, wet washcloth or a cotton ball.  Place a clean, cool, wet cloth on your eye. Do this for 10-20 minutes, 3-4 times a day.  General  instructions  Do not wear contacts until the infection is gone. Wear glasses until your doctor says it is okay to wear contacts again.  Do not wear eye makeup until the infection is gone. Throw away old eye makeup.  Change or wash your pillowcase every day.  Do not share towels or washcloths.  Wash your hands often with soap and water for at least 20 seconds and especially before touching your face or eyes. Use paper towels to dry your hands.  Do not touch or rub your eyes.  Do not drive or use heavy machinery if your vision is blurred.  Contact a doctor if:  You have a fever.  You do not get better after 10 days.  Get help right away if:  You have a fever and your symptoms get worse all of a sudden.  You have very bad pain when you move your eye.  Your face:  Hurts.  Is red.  Is swollen.  You have sudden loss of vision.  Summary  Bacterial conjunctivitis is an infection of your conjunctiva.  This infection spreads easily from person to person.  Wash your hands often with soap and water for at least 20 seconds and especially before touching your face or eyes. Use paper towels to dry your hands.  Take or apply your  antibiotic medicine as told by your doctor.  Contact a doctor if you have a fever or you do not get better after 10 days.  This information is not intended to replace advice given to you by your health care provider. Make sure you discuss any questions you have with your health care provider.  Document Revised: 01/26/2021 Document Reviewed: 01/26/2021  Elsevier Patient Education  2024 [employer].

## 2023-10-04 NOTE — Progress Notes (Signed)
Established Patient Office Visit  Subjective:  Patient ID: Natasha Kennedy, female    DOB: 05-31-1961  Age: 62 y.o. MRN: [id number]  CC:  Chief Complaint  Patient presents with   Acute Visit    Possible pink eye     Natasha Kennedy presents for acute visit.  Conjunctivitis  Episode onset: last night. The onset was sudden. The problem has been unchanged. The problem is mild. Nothing aggravates the symptoms. Associated symptoms include eye discharge and eye redness. Pertinent negatives include no fever, no rhinorrhea and no sore throat. The left eye is affected.    Past Medical History:  Diagnosis Date   Anxiety    Depression    Ectopic pregnancy 1994   Family history of breast cancer 2013   26.6% lifetime risk   OCD (obsessive compulsive disorder)    Plantar fasciitis of left foot    Psoriasis     Past Surgical History:  Procedure Laterality Date   CHOLECYSTECTOMY  2008   COLONOSCOPY  01/2014   negative   ECTOPIC PREGNANCY SURGERY  1994   RHINOPLASTY  1978   WISDOM TOOTH EXTRACTION      Family History  Problem Relation Age of Onset   Cancer Mother    Arthritis Mother    Breast cancer Mother 38   Heart disease Mother        pacemaker   Hypertension Mother    Hip fracture Mother    Heart disease Father        heart attack/ cardiac arrest-2013   Hyperlipidemia Father    Hypertension Father    Cancer Sister    Hypertension Sister    Breast cancer Sister 67   Hypertension Brother    Breast cancer Paternal Aunt 36   Depression Maternal Grandmother    Breast cancer Maternal Grandmother 53   Hyperlipidemia Paternal Grandmother    Heart disease Paternal Grandmother    Breast cancer Cousin 33    Social History   Socioeconomic History   Marital status: Married    Spouse name: Not on file   Number of children: 2   Years of education: Not on file   Highest education level: Master's degree (e.g., MA, MS, MEng, MEd, MSW, MBA)  Occupational History   Occupation:  first Merchant navy officer  Tobacco Use   Smoking status: Never   Smokeless tobacco: Never  Vaping Use   Vaping status: Never Used  Substance and Sexual Activity   Alcohol use: No    Comment: very limited   Drug use: No    Comment: Patient denies    Sexual activity: Yes    Birth control/protection: None  Other Topics Concern   Not on file  Social History Narrative   Not on file   Social Determinants of Health   Financial Resource Strain: Low Risk  (02/12/2023)   Overall Financial Resource Strain (CARDIA)    Difficulty of Paying Living Expenses: Not hard at all  Food Insecurity: No Food Insecurity (02/12/2023)   Hunger Vital Sign    Worried About Running Out of Food in the Last Year: Never true    Ran Out of Food in the Last Year: Never true  Transportation Needs: No Transportation Needs (02/12/2023)   PRAPARE - Administrator, Civil Service (Medical): No    Lack of Transportation (Non-Medical): No  Physical Activity: Insufficiently Active (02/12/2023)   Exercise Vital Sign    Days of Exercise per Week: 4 days    Minutes  of Exercise per Session: 30 min  Stress: Patient Declined (02/12/2023)   [employer] of Occupational Health - Occupational Stress Questionnaire    Feeling of Stress : Patient declined  Social Connections: Socially Integrated (02/12/2023)   Social Connection and Isolation Panel [NHANES]    Frequency of Communication with Friends and Family: More than three times a week    Frequency of Social Gatherings with Friends and Family: Patient declined    Attends Religious Services: More than 4 times per year    Active Member of [employer] or Organizations: Yes    Attends Engineer, structural: More than 4 times per year    Marital Status: Married  Catering manager Violence: Not on file     Outpatient Medications Prior to Visit  Medication Sig Dispense Refill   ALPRAZolam (XANAX) 0.25 MG tablet TAKE 1 TABLET(0.25 MG) BY MOUTH THREE TIMES DAILY AS  NEEDED 90 tablet 4   busPIRone (BUSPAR) 30 MG tablet Take 1 tablet (30 mg total) by mouth 2 (two) times daily. 60 tablet 11   clomiPRAMINE (ANAFRANIL) 50 MG capsule Take 1 capsule (50 mg total) by mouth at bedtime. 30 capsule 11   fluvoxaMINE (LUVOX) 100 MG tablet 2 po bid 120 tablet 11   Melatonin 3-10 MG TABS Take by mouth at bedtime as needed.     triamcinolone cream (KENALOG) 0.1 % Apply 1 Application topically 2 (two) times daily. Avoid face, groin, underarms. 453 g 1   No facility-administered medications prior to visit.    No Known Allergies  ROS Review of Systems  Constitutional:  Negative for fever.  HENT:  Negative for rhinorrhea and sore throat.   Eyes:  Positive for discharge and redness.   Negative unless indicated in HPI.    Objective:    Physical Exam Constitutional:      Appearance: Normal appearance.  HENT:     Head: Normocephalic.  Eyes:     General: Lids are normal. Vision grossly intact.     Conjunctiva/sclera:     Right eye: Right conjunctiva is not injected.     Left eye: Left conjunctiva is injected.  Cardiovascular:     Rate and Rhythm: Normal rate and regular rhythm.     Pulses: Normal pulses.     Heart sounds: Normal heart sounds.  Pulmonary:     Effort: Pulmonary effort is normal.     Breath sounds: Normal breath sounds. No rales.  Neurological:     General: No focal deficit present.     Mental Status: She is alert and oriented to person, place, and time. Mental status is at baseline.     Motor: No weakness.  Psychiatric:        Mood and Affect: Mood normal.        Behavior: Behavior normal.     BP 126/74   Pulse 68   Temp 97.8 F (36.6 C)   Ht 5\' 3"  (1.6 m)   Wt 130 lb 6.4 oz (59.1 kg)   LMP 04/26/2016 (Approximate) Comment: Just had spotting only  SpO2 98%   BMI 23.10 kg/m  Wt Readings from Last 3 Encounters:  10/04/23 130 lb 6.4 oz (59.1 kg)  08/24/23 132 lb (59.9 kg)  02/12/23 128 lb 3.2 oz (58.2 kg)     Health  Maintenance  Topic Date Due   Zoster Vaccines- Shingrix (1 of 2) Never done   Colonoscopy  12/29/2023   COVID-19 Vaccine (3 - Pfizer risk series) 10/20/2023 (Originally 03/03/2020)  MAMMOGRAM  08/29/2024   DTaP/Tdap/Td (8 - Td or Tdap) 04/11/2027   Cervical Cancer Screening (HPV/Pap Cotest)  07/27/2027   INFLUENZA VACCINE  Completed   Hepatitis C Screening  Completed   HIV Screening  Completed   HPV VACCINES  Aged Out    There are no preventive care reminders to display for this patient.  Lab Results  Component Value Date   TSH 1.56 07/20/2022   Lab Results  Component Value Date   WBC 5.4 04/11/2018   HGB 12.7 04/11/2018   HCT 38.2 04/11/2018   MCV 86 04/11/2018   PLT 228 04/11/2018   Lab Results  Component Value Date   NA 138 08/21/2023   K 3.8 08/21/2023   CO2 29 08/21/2023   GLUCOSE 92 08/21/2023   BUN 23 08/21/2023   CREATININE 0.91 08/21/2023   BILITOT 0.4 08/21/2023   ALKPHOS 51 08/21/2023   AST 16 08/21/2023   ALT 14 08/21/2023   PROT 7.6 08/21/2023   ALBUMIN 4.3 08/21/2023   CALCIUM 10.3 08/21/2023   ANIONGAP 6 06/27/2015   GFR 68.89 08/21/2023   Lab Results  Component Value Date   CHOL 183 11/27/2022   Lab Results  Component Value Date   HDL 67 11/27/2022   Lab Results  Component Value Date   LDLCALC 97 11/27/2022   Lab Results  Component Value Date   TRIG 93 11/27/2022   Lab Results  Component Value Date   CHOLHDL 2.7 11/27/2022   Lab Results  Component Value Date   HGBA1C 6.1 08/21/2023      Assessment & Plan:  Acute bacterial conjunctivitis of left eye Assessment & Plan: Left conjunctiva injected, Will treat with Moxifloxacin ophthalmic drops three times a day in the left eye. Wipe the discharge with warm, wet  cotton or wash cloth. Do not apply eye makeup.    Other orders -     Moxifloxacin HCl; Place 1 drop into the left eye 3 (three) times daily.  Dispense: 3 mL; Refill: 0    Follow-up: Return if symptoms worsen or  fail to improve.   Kara Dies, NP

## 2023-10-26 ENCOUNTER — Other Ambulatory Visit: Payer: Self-pay | Admitting: Physician Assistant

## 2023-10-29 ENCOUNTER — Ambulatory Visit: Payer: BC Managed Care – PPO | Admitting: Physician Assistant

## 2023-10-31 ENCOUNTER — Other Ambulatory Visit: Payer: Self-pay | Admitting: Physician Assistant

## 2023-11-13 ENCOUNTER — Ambulatory Visit
Admission: RE | Admit: 2023-11-13 | Discharge: 2023-11-13 | Disposition: A | Discharge status: Home / Self Care | Payer: 59 | Source: Ambulatory Visit | Attending: Internal Medicine | Admitting: Internal Medicine

## 2023-11-13 DIAGNOSIS — Z1231 Encounter for screening mammogram for malignant neoplasm of breast: Secondary | ICD-10-CM | POA: Insufficient documentation

## 2023-11-29 ENCOUNTER — Encounter: Payer: Self-pay | Admitting: Physician Assistant

## 2023-11-29 ENCOUNTER — Ambulatory Visit (INDEPENDENT_AMBULATORY_CARE_PROVIDER_SITE_OTHER): Payer: BC Managed Care – PPO | Admitting: Physician Assistant

## 2023-11-29 DIAGNOSIS — F411 Generalized anxiety disorder: Secondary | ICD-10-CM | POA: Diagnosis not present

## 2023-11-29 DIAGNOSIS — F3342 Major depressive disorder, recurrent, in full remission: Secondary | ICD-10-CM | POA: Diagnosis not present

## 2023-11-29 DIAGNOSIS — F422 Mixed obsessional thoughts and acts: Secondary | ICD-10-CM

## 2023-11-29 MED ORDER — ALPRAZOLAM 0.25 MG PO TABS: ORAL_TABLET | ORAL | 5 refills | Status: DC

## 2023-11-29 MED ORDER — BUSPIRONE HCL 30 MG PO TABS: 30.0000 mg | ORAL_TABLET | Freq: Two times a day (BID) | ORAL | 11 refills | Status: DC

## 2023-11-29 MED ORDER — FLUVOXAMINE MALEATE 100 MG PO TABS: 200.0000 mg | ORAL_TABLET | Freq: Two times a day (BID) | ORAL | 5 refills | Status: DC

## 2023-11-29 NOTE — Progress Notes (Signed)
[facility]  Patient ID: Natasha Kennedy,  MRN: [id number]  PCP: Sherlene Shams, MD  Date of Evaluation: 11/29/2023 Time spent:20 minutes  Chief Complaint:  Chief Complaint   Anxiety; Depression; Follow-up    HISTORY/CURRENT STATUS: HPI For routine med check.  Natasha Kennedy is doing really well.  Has a new granddaughter that was born last fall.  She is very happy about that.  Feels like her medications are still working. Patient is able to enjoy things.  Energy and motivation are good.  Work is going well.  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  No extreme sadness, tearfulness, or feelings of hopelessness.  Sleeps well. ADLs and personal hygiene are normal.   Denies any changes in concentration, making decisions, or remembering things.  Appetite has not changed.  Weight is stable.    Denies suicidal or homicidal thoughts.  No obsessions or compulsions to speak of.  She is responding to current treatment very well.  She does need the Xanax for rescue sometimes.  It is effective when she needs it.  She does not usually have panic attacks but just gets overwhelmed.  Patient denies increased energy with decreased need for sleep, increased talkativeness, racing thoughts, impulsivity or risky behaviors, increased spending, increased libido, grandiosity, increased irritability or anger, paranoia, or hallucinations.  Denies dizziness, syncope, seizures, numbness, tingling, tremor, tics, unsteady gait, slurred speech, confusion. Denies muscle or joint pain, stiffness, or dystonia.  Individual Medical History/ Review of Systems: Changes? :No     Past medications for mental health diagnoses include: Luvox, BuSpar, Xanax, Seroquel, Abilify caused nausea and vomiting, Effexor, clomipramine  Allergies: Patient has no known allergies.  Current Medications:  Current Outpatient Medications:    clomiPRAMINE (ANAFRANIL) 50 MG capsule, Take 1 capsule (50 mg total) by  mouth at bedtime., Disp: 30 capsule, Rfl: 11   Melatonin 3-10 MG TABS, Take by mouth at bedtime as needed., Disp: , Rfl:    moxifloxacin (VIGAMOX) 0.5 % ophthalmic solution, Place 1 drop into the left eye 3 (three) times daily., Disp: 3 mL, Rfl: 0   triamcinolone cream (KENALOG) 0.1 %, Apply 1 Application topically 2 (two) times daily. Avoid face, groin, underarms., Disp: 453 g, Rfl: 1   ALPRAZolam (XANAX) 0.25 MG tablet, TAKE 1 TABLET(0.25 MG) BY MOUTH THREE TIMES DAILY AS NEEDED, Disp: 90 tablet, Rfl: 5   busPIRone (BUSPAR) 30 MG tablet, Take 1 tablet (30 mg total) by mouth 2 (two) times daily., Disp: 60 tablet, Rfl: 11   fluvoxaMINE (LUVOX) 100 MG tablet, Take 2 tablets (200 mg total) by mouth 2 (two) times daily., Disp: 120 tablet, Rfl: 5 Medication Side Effects: none  Family Medical/ Social History: Changes?  no  MENTAL HEALTH EXAM:  Last menstrual period 04/26/2016.There is no height or weight on file to calculate BMI.  General Appearance: Casual, Neat and Well Groomed  Eye Contact:  Good  Speech:  Clear and Coherent and Normal Rate  Volume:  Normal  Mood:  Euthymic  Affect:  Congruent  Thought Process:  Goal Directed and Descriptions of Associations: Circumstantial  Orientation:  Full (Time, Place, and Person)  Thought Content: Logical   Suicidal Thoughts:  No  Homicidal Thoughts:  No  Memory:  WNL  Judgement:  Good  Insight:  Good  Psychomotor Activity:  Normal  Concentration:  Concentration: Good and Attention Span: Good  Recall:  Good  Fund of Knowledge: Good  Language: Good  Assets:  Communication Skills Desire  for Improvement Financial Resources/Insurance Housing Physical Health Resilience Social Support Transportation Vocational/Educational  ADL's:  Intact  Cognition: WNL  Prognosis:  Good   Labs from 08/21/2023 reviewed in chart.  DIAGNOSES:    ICD-10-CM   1. Mixed obsessional thoughts and acts  F42.2     2. Generalized anxiety disorder  F41.1      3. Major depression, recurrent, full remission (HCC)  F33.42      Receiving Psychotherapy: No   RECOMMENDATIONS:  PDMP was reviewed.  Last Xanax filled 11/07/2023. I provided 20 minutes of face to face time during this encounter, including time spent before and after the visit in records review, medical decision making, counseling pertinent to today's visit, and charting.   Congratulations on the birth of her granddaughter!  She is doing well so no changes need to be made.  Continue Xanax 0.25 mg 1 p.o. every morning and 2 p.o. nightly routinely.   Continue BuSpar 30 mg, 1 p.o. twice daily. Continue clomipramine 50 mg, 1 p.o. nightly. Continue Luvox 100 mg, 2 p.o. twice daily. Return in 6 months.  Natasha Overly, PA-C

## 2024-04-09 ENCOUNTER — Other Ambulatory Visit: Payer: Self-pay | Admitting: Physician Assistant

## 2024-05-19 ENCOUNTER — Other Ambulatory Visit: Payer: Self-pay | Admitting: Physician Assistant

## 2024-05-26 ENCOUNTER — Encounter: Payer: Self-pay | Admitting: Physician Assistant

## 2024-05-26 ENCOUNTER — Ambulatory Visit (INDEPENDENT_AMBULATORY_CARE_PROVIDER_SITE_OTHER): Payer: 59 | Admitting: Physician Assistant

## 2024-05-26 DIAGNOSIS — F411 Generalized anxiety disorder: Secondary | ICD-10-CM

## 2024-05-26 DIAGNOSIS — F422 Mixed obsessional thoughts and acts: Secondary | ICD-10-CM

## 2024-05-26 DIAGNOSIS — F3342 Major depressive disorder, recurrent, in full remission: Secondary | ICD-10-CM | POA: Diagnosis not present

## 2024-05-26 MED ORDER — CLOMIPRAMINE HCL 50 MG PO CAPS: 50.0000 mg | ORAL_CAPSULE | Freq: Every day | ORAL | 5 refills | Status: DC

## 2024-05-26 MED ORDER — ALPRAZOLAM 0.25 MG PO TABS: ORAL_TABLET | ORAL | 5 refills | Status: DC

## 2024-05-26 MED ORDER — FLUVOXAMINE MALEATE 100 MG PO TABS: 200.0000 mg | ORAL_TABLET | Freq: Two times a day (BID) | ORAL | 5 refills | Status: DC

## 2024-05-26 NOTE — Progress Notes (Signed)
 [facility]  Patient ID: Natasha Kennedy,  MRN: [id number]  PCP: Marylynn Verneita CROME, MD  Date of Evaluation: 05/26/2024 Time spent:20 minutes  Chief Complaint:  Chief Complaint   Anxiety; Depression; Follow-up    HISTORY/CURRENT STATUS: HPI For routine med check.  Doing really well. Feels like her meds are working as intended. 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 Has been reading a lot and enjoying that. Went to [employer] and had a good time. No reports of sadness, tearfulness, or feelings of hopelessness.  Sleeps ok. ADLs and personal hygiene are normal.  No changes in memory, focus, concentration.  Appetite has not changed.  Weight is stable.   Is anxious at times, takes Xanax  when needed which is helpful. No mania, delirium, AH/VH.  No SI/HI.  Denies dizziness, syncope, seizures, numbness, tingling, tremor, tics, unsteady gait, slurred speech, confusion.   Individual Medical History/ Review of Systems: Changes? :No     Past medications for mental health diagnoses include: Luvox , BuSpar , Xanax , Seroquel, Abilify  caused nausea and vomiting, Effexor , clomipramine   Allergies: Patient has no known allergies.  Current Medications:  Current Outpatient Medications:    busPIRone  (BUSPAR ) 30 MG tablet, Take 1 tablet (30 mg total) by mouth 2 (two) times daily., Disp: 60 tablet, Rfl: 11   Melatonin 3-10 MG TABS, Take by mouth at bedtime as needed., Disp: , Rfl:    ALPRAZolam  (XANAX ) 0.25 MG tablet, TAKE 1 TABLET(0.25 MG) BY MOUTH THREE TIMES DAILY AS NEEDED, Disp: 90 tablet, Rfl: 5   clomiPRAMINE  (ANAFRANIL ) 50 MG capsule, Take 1 capsule (50 mg total) by mouth at bedtime., Disp: 30 capsule, Rfl: 5   fluvoxaMINE  (LUVOX ) 100 MG tablet, Take 2 tablets (200 mg total) by mouth 2 (two) times daily., Disp: 120 tablet, Rfl: 5   moxifloxacin  (VIGAMOX ) 0.5 % ophthalmic solution, Place 1 drop  into the left eye 3 (three) times daily. (Patient not taking: Reported on 05/26/2024), Disp: 3 mL, Rfl: 0   triamcinolone  cream (KENALOG ) 0.1 %, Apply 1 Application topically 2 (two) times daily. Avoid face, groin, underarms. (Patient not taking: Reported on 05/26/2024), Disp: 453 g, Rfl: 1 Medication Side Effects: none  Family Medical/ Social History: Changes?  no  MENTAL HEALTH EXAM:  Last menstrual period 04/26/2016.There is no height or weight on file to calculate BMI.  General Appearance: Casual, Neat and Well Groomed  Eye Contact:  Good  Speech:  Clear and Coherent and Normal Rate  Volume:  Normal  Mood:  Euthymic  Affect:  Congruent  Thought Process:  Goal Directed and Descriptions of Associations: Circumstantial  Orientation:  Full (Time, Place, and Person)  Thought Content: Logical   Suicidal Thoughts:  No  Homicidal Thoughts:  No  Memory:  WNL  Judgement:  Good  Insight:  Good  Psychomotor Activity:  Normal  Concentration:  Concentration: Good and Attention Span: Good  Recall:  Good  Fund of Knowledge: Good  Language: Good  Assets:  Communication Skills Desire for Improvement Financial Resources/Insurance Housing Leisure Time Physical Health Resilience Social Support Transportation Vocational/Educational  ADL's:  Intact  Cognition: WNL  Prognosis:  Good   Labs from 08/21/2023 reviewed in chart.  DIAGNOSES:    ICD-10-CM   1. Mixed obsessional thoughts and acts  F42.2     2. Generalized anxiety disorder  F41.1     3. Major depression, recurrent, full remission (  HCC)  F33.42       Receiving Psychotherapy: No   RECOMMENDATIONS:  PDMP was reviewed.  Last Xanax  filled 05/06/2024. I provided approximately 20  minutes of face to face time during this encounter, including time spent before and after the visit in records review, medical decision making, counseling pertinent to today's visit, and charting.   I am glad to see her doing so well.  No changes need  to be made.  Continue Xanax  0.25 mg 1 p.o. every morning and 2 p.o. nightly routinely.   Continue BuSpar  30 mg, 1 p.o. twice daily. Continue clomipramine  50 mg, 1 p.o. nightly. Continue Luvox  100 mg, 2 p.o. twice daily. Return in 6 months.  Verneita Cooks, PA-C

## 2024-08-20 ENCOUNTER — Ambulatory Visit: Payer: Self-pay

## 2024-08-20 NOTE — Telephone Encounter (Signed)
 Noted

## 2024-08-20 NOTE — Telephone Encounter (Signed)
 FYI Only or Action Required?: FYI only for provider.  Patient was last seen in primary care on 10/04/2023 by Vincente Saber, NP.  Called Nurse Triage reporting Jaw Pain.  Symptoms began several weeks ago.  Interventions attempted: Other: Chewing on opposite side of mouth; ibuprofen .  Symptoms are: unchanged.  Triage Disposition: See PCP When Office is Open (Within 3 Days)  Patient/caregiver understands and will follow disposition?: Yes  **Appt scheduled for 10/23**       Copied from CRM [id number]. Topic: Clinical - Red Word Triage >> Aug 20, 2024  3:50 PM Rosina BIRCH wrote: Red Word that prompted transfer to Nurse Triage: jaw pain for three weeks and hurts when she chews and don't chew Reason for Disposition  [1] MILD pain (e.g., does not interfere with normal activities) AND [2] constant AND [3] present > 24 hours  (Exception: Symptom is chronic.)  Answer Assessment - Initial Assessment Questions 1. ONSET: When did the pain start? (e.g., minutes, hours, days)      X 3 weeks patient has had pain in her right jaw    2. ONSET: Does the pain come and go, or has it been constant since it started? (e.g., constant, intermittent, fleeting)     Intermittant  3. SEVERITY: How bad is the pain? (Scale 1-10; mild, moderate or severe)     Aching, dull pain, when eating 5/10  4. LOCATION: Where does it hurt?      Right jaw, right ear pain when patient chews   5. RASH: Is there any redness, rash, or swelling of your face?     No   6. FEVER: Do you have a fever? If Yes, ask: What is it, how was it measured, and when did it start?      No   7. OTHER SYMPTOMS: Do you have any other symptoms? (e.g., fever, toothache, nasal discharge, nasal congestion, clicking sensation in jaw joint)  No .  For pain patient is taking Ibuprofen . Appt scheduled for 10/23 for evaluation  Protocols used: Face Pain-A-AH

## 2024-08-21 ENCOUNTER — Ambulatory Visit: Admitting: Nurse Practitioner

## 2024-08-21 ENCOUNTER — Encounter: Payer: Self-pay | Admitting: Nurse Practitioner

## 2024-08-21 VITALS — BP 120/76 | HR 82 | Temp 97.7°F | Ht 63.0 in | Wt 138.8 lb

## 2024-08-21 DIAGNOSIS — M26621 Arthralgia of right temporomandibular joint: Secondary | ICD-10-CM | POA: Diagnosis not present

## 2024-08-21 MED ORDER — PREDNISONE 20 MG PO TABS: 40.0000 mg | ORAL_TABLET | Freq: Every day | ORAL | 0 refills | Status: AC

## 2024-08-21 NOTE — Patient Instructions (Signed)
 RIGHT TEMPOROMANDIBULAR JOINT DISORDER: You have right jaw pain with TMJ inflammation, which is made worse by eating and chewing, and it is causing referred pain to your ear. -You are prescribed prednisone for 5 days to reduce inflammation. Please take it in the morning with breakfast. -Use a heating pad to help relieve the pain. -If your symptoms persist after finishing the prednisone, please follow up with a dentist.

## 2024-08-21 NOTE — Assessment & Plan Note (Signed)
 Right TMJ tenderness, exacerbated by eating and chewing, with referred ear pain. - Prescribed prednisone for 5 days to reduce inflammation.  - Advised use of a heating pad for pain relief. - Advised to see dentist if symptoms persist.

## 2024-08-21 NOTE — Progress Notes (Signed)
 Established Patient Office Visit  Subjective:  Patient ID: Natasha Kennedy, female    DOB: 08/10/1961  Age: 63 y.o. MRN: [id number]  CC:  Chief Complaint  Patient presents with   Acute Visit    Right Jaw pain x several weeks was intermittent now constant dull pain and right ear pain 4/10   Discussed the use of a AI scribe software for clinical note transcription with the patient, who gave verbal consent to proceed.  HPI Natasha Kennedy is a 63 year old female who presents with right jaw and ear pain.  She has experienced right jaw pain for almost three weeks, initially acute and severe, now a dull ache. The pain is triggered by eating or chewing, and she sometimes clenches her teeth while standing at work. There is no recent injury or fall.  She also has ear pain, described as internal, with recurring discharge from the outer ear. There is no difficulty hearing, tinnitus, or squeaking sounds.  She uses a night guard for teeth grinding and clenching, which she believes helps. Advil  provides slight improvement, and cold compresses have not been effective.  Her dental history includes a visit in July with a report of a few small cavities, though she is unsure of their location. She is not experiencing any tooth pain.   HPI   Past Medical History:  Diagnosis Date   Anxiety    Depression    Ectopic pregnancy 1994   Family history of breast cancer 2013   26.6% lifetime risk   OCD (obsessive compulsive disorder)    Plantar fasciitis of left foot    Psoriasis     Past Surgical History:  Procedure Laterality Date   CHOLECYSTECTOMY  2008   COLONOSCOPY  01/2014   negative   ECTOPIC PREGNANCY SURGERY  1994   RHINOPLASTY  1978   WISDOM TOOTH EXTRACTION      Family History  Problem Relation Age of Onset   Cancer Mother    Arthritis Mother    Breast cancer Mother 28   Heart disease Mother        pacemaker   Hypertension Mother    Hip fracture Mother    Heart disease Father         heart attack/ cardiac arrest-2013   Hyperlipidemia Father    Hypertension Father    Cancer Sister    Hypertension Sister    Breast cancer Sister 43   Hypertension Brother    Breast cancer Paternal Aunt 58   Depression Maternal Grandmother    Breast cancer Maternal Grandmother 30   Hyperlipidemia Paternal Grandmother    Heart disease Paternal Grandmother    Breast cancer Cousin 60    Social History   Socioeconomic History   Marital status: Married    Spouse name: Not on file   Number of children: 2   Years of education: Not on file   Highest education level: Master's degree (e.g., MA, MS, MEng, MEd, MSW, MBA)  Occupational History   Occupation: first Merchant navy officer  Tobacco Use   Smoking status: Never   Smokeless tobacco: Never  Vaping Use   Vaping status: Never Used  Substance and Sexual Activity   Alcohol use: No    Comment: very limited   Drug use: No    Comment: Patient denies    Sexual activity: Yes    Birth control/protection: None  Other Topics Concern   Not on file  Social History Narrative   Not on file  Social Drivers of Corporate investment banker Strain: Low Risk  (02/12/2023)   Overall Financial Resource Strain (CARDIA)    Difficulty of Paying Living Expenses: Not hard at all  Food Insecurity: No Food Insecurity (02/12/2023)   Hunger Vital Sign    Worried About Running Out of Food in the Last Year: Never true    Ran Out of Food in the Last Year: Never true  Transportation Needs: No Transportation Needs (02/12/2023)   PRAPARE - Administrator, Civil Service (Medical): No    Lack of Transportation (Non-Medical): No  Physical Activity: Insufficiently Active (02/12/2023)   Exercise Vital Sign    Days of Exercise per Week: 4 days    Minutes of Exercise per Session: 30 min  Stress: Patient Declined (02/12/2023)   [employer] of Occupational Health - Occupational Stress Questionnaire    Feeling of Stress : Patient declined  Social  Connections: Socially Integrated (02/12/2023)   Social Connection and Isolation Panel    Frequency of Communication with Friends and Family: More than three times a week    Frequency of Social Gatherings with Friends and Family: Patient declined    Attends Religious Services: More than 4 times per year    Active Member of [employer] or Organizations: Yes    Attends Engineer, structural: More than 4 times per year    Marital Status: Married  Catering manager Violence: Not on file     Outpatient Medications Prior to Visit  Medication Sig Dispense Refill   ALPRAZolam  (XANAX ) 0.25 MG tablet TAKE 1 TABLET(0.25 MG) BY MOUTH THREE TIMES DAILY AS NEEDED 90 tablet 5   busPIRone  (BUSPAR ) 30 MG tablet Take 1 tablet (30 mg total) by mouth 2 (two) times daily. 60 tablet 11   clomiPRAMINE  (ANAFRANIL ) 50 MG capsule Take 1 capsule (50 mg total) by mouth at bedtime. 30 capsule 5   fluvoxaMINE  (LUVOX ) 100 MG tablet Take 2 tablets (200 mg total) by mouth 2 (two) times daily. 120 tablet 5   Melatonin 3-10 MG TABS Take by mouth at bedtime as needed.     moxifloxacin  (VIGAMOX ) 0.5 % ophthalmic solution Place 1 drop into the left eye 3 (three) times daily. (Patient not taking: Reported on 08/21/2024) 3 mL 0   triamcinolone  cream (KENALOG ) 0.1 % Apply 1 Application topically 2 (two) times daily. Avoid face, groin, underarms. (Patient not taking: Reported on 08/21/2024) 453 g 1   No facility-administered medications prior to visit.    No Known Allergies  ROS Review of Systems Negative unless indicated in HPI.    Objective:    Physical Exam Constitutional:      Appearance: Normal appearance.  HENT:     Head:     Comments: R TMJ joint tenderness     Right Ear: Tympanic membrane normal.     Left Ear: Tympanic membrane normal.     Mouth/Throat:     Mouth: Mucous membranes are moist.  Eyes:     Conjunctiva/sclera: Conjunctivae normal.     Pupils: Pupils are equal, round, and reactive to light.   Cardiovascular:     Rate and Rhythm: Normal rate and regular rhythm.     Pulses: Normal pulses.     Heart sounds: Normal heart sounds.  Pulmonary:     Effort: Pulmonary effort is normal.     Breath sounds: Normal breath sounds.  Musculoskeletal:     Cervical back: Normal range of motion. No tenderness.  Skin:  General: Skin is warm.     Findings: No bruising.  Neurological:     General: No focal deficit present.     Mental Status: She is alert and oriented to person, place, and time. Mental status is at baseline.  Psychiatric:        Mood and Affect: Mood normal.        Behavior: Behavior normal.        Thought Content: Thought content normal.        Judgment: Judgment normal.     BP 120/76   Pulse 82   Temp 97.7 F (36.5 C)   Ht 5' 3 (1.6 m)   Wt 138 lb 12.8 oz (63 kg)   LMP 04/26/2016 (Approximate) Comment: Just had spotting only  SpO2 97%   BMI 24.59 kg/m  Wt Readings from Last 3 Encounters:  08/21/24 138 lb 12.8 oz (63 kg)  10/04/23 130 lb 6.4 oz (59.1 kg)  08/24/23 132 lb (59.9 kg)     Health Maintenance  Topic Date Due   Zoster Vaccines- Shingrix (1 of 2) Never done   Pneumococcal Vaccine: 50+ Years (1 of 1 - PCV) Never done   Colonoscopy  12/29/2023   COVID-19 Vaccine (3 - Pfizer risk series) 09/06/2024 (Originally 03/03/2020)   Influenza Vaccine  01/27/2025 (Originally 05/30/2024)   Mammogram  11/12/2025   DTaP/Tdap/Td (8 - Td or Tdap) 04/11/2027   Cervical Cancer Screening (HPV/Pap Cotest)  07/27/2027   Hepatitis C Screening  Completed   HIV Screening  Completed   Hepatitis B Vaccines 19-59 Average Risk  Aged Out   HPV VACCINES  Aged Out   Meningococcal B Vaccine  Aged Out    There are no preventive care reminders to display for this patient.  Lab Results  Component Value Date   TSH 1.56 07/20/2022   Lab Results  Component Value Date   WBC 5.4 04/11/2018   HGB 12.7 04/11/2018   HCT 38.2 04/11/2018   MCV 86 04/11/2018   PLT 228 04/11/2018    Lab Results  Component Value Date   NA 138 08/21/2023   K 3.8 08/21/2023   CO2 29 08/21/2023   GLUCOSE 92 08/21/2023   BUN 23 08/21/2023   CREATININE 0.91 08/21/2023   BILITOT 0.4 08/21/2023   ALKPHOS 51 08/21/2023   AST 16 08/21/2023   ALT 14 08/21/2023   PROT 7.6 08/21/2023   ALBUMIN 4.3 08/21/2023   CALCIUM 10.3 08/21/2023   ANIONGAP 6 06/27/2015   GFR 68.89 08/21/2023   Lab Results  Component Value Date   CHOL 183 11/27/2022   Lab Results  Component Value Date   HDL 67 11/27/2022   Lab Results  Component Value Date   LDLCALC 97 11/27/2022   Lab Results  Component Value Date   TRIG 93 11/27/2022   Lab Results  Component Value Date   CHOLHDL 2.7 11/27/2022   Lab Results  Component Value Date   HGBA1C 6.1 08/21/2023      Assessment & Plan:  TMJ tenderness, right Assessment & Plan: Right TMJ tenderness, exacerbated by eating and chewing, with referred ear pain. - Prescribed prednisone for 5 days to reduce inflammation.  - Advised use of a heating pad for pain relief. - Advised to see dentist if symptoms persist.     Other orders -     predniSONE; Take 2 tablets (40 mg total) by mouth daily with breakfast for 5 days.  Dispense: 10 tablet; Refill: 0  Follow-up: No follow-ups on file.   Charde Macfarlane, NP

## 2024-11-26 ENCOUNTER — Telehealth (INDEPENDENT_AMBULATORY_CARE_PROVIDER_SITE_OTHER): Admitting: Physician Assistant

## 2024-11-26 ENCOUNTER — Encounter: Payer: Self-pay | Admitting: Physician Assistant

## 2024-11-26 DIAGNOSIS — F411 Generalized anxiety disorder: Secondary | ICD-10-CM | POA: Diagnosis not present

## 2024-11-26 DIAGNOSIS — F422 Mixed obsessional thoughts and acts: Secondary | ICD-10-CM

## 2024-11-26 DIAGNOSIS — F3342 Major depressive disorder, recurrent, in full remission: Secondary | ICD-10-CM | POA: Diagnosis not present

## 2024-11-26 MED ORDER — FLUVOXAMINE MALEATE 100 MG PO TABS: 200.0000 mg | ORAL_TABLET | Freq: Two times a day (BID) | ORAL | 11 refills | Status: AC

## 2024-11-26 MED ORDER — BUSPIRONE HCL 30 MG PO TABS: 30.0000 mg | ORAL_TABLET | Freq: Two times a day (BID) | ORAL | 11 refills | Status: AC

## 2024-11-26 MED ORDER — ALPRAZOLAM 0.25 MG PO TABS: ORAL_TABLET | ORAL | 5 refills | Status: AC

## 2024-11-26 MED ORDER — CLOMIPRAMINE HCL 50 MG PO CAPS: 50.0000 mg | ORAL_CAPSULE | Freq: Every day | ORAL | 11 refills | Status: AC

## 2024-11-26 NOTE — Progress Notes (Unsigned)
 "     [facility] Check  Patient ID: Natasha Kennedy,  MRN: [id number]  PCP: Marylynn Verneita CROME, MD  Date of Evaluation: 11/26/2024 Time spent:20 minutes  Chief Complaint:  Chief Complaint   Anxiety; Follow-up   Virtual Visit via Telehealth  I connected with patient by a video enabled telemedicine application or telephone, with their informed consent, and verified patient privacy and that I am speaking with the correct person using two identifiers.  I am private, in my office and the patient is at home work.  I discussed the limitations, risks, security and privacy concerns of performing an evaluation and management service by telephone video and the availability of in person appointments. I also discussed with the patient that there may be a patient responsible charge related to this service. The patient expressed understanding and agreed to proceed.   I discussed the assessment and treatment plan with the patient. The patient was provided an opportunity to ask questions and all were answered. The patient agreed with the plan and demonstrated an understanding of the instructions.   The patient was advised to call back or seek an in-person evaluation if the symptoms worsen or if the condition fails to improve as anticipated.  I provided approximately 20  30  40  minutes of non-face-to-face time during this encounter.  HISTORY/CURRENT STATUS: HPI For routine med check.  Lore is doing well.    Individual Medical History/ Review of Systems: Changes? :No     Past medications for mental health diagnoses include: Luvox , BuSpar , Xanax , Seroquel, Abilify  caused nausea and vomiting, Effexor , clomipramine   Allergies: Patient has no known allergies.  Current Medications:  Current Outpatient Medications:    Melatonin 3-10 MG TABS, Take by mouth at bedtime as needed., Disp: , Rfl:    ALPRAZolam  (XANAX ) 0.25 MG tablet, TAKE 1 TABLET(0.25 MG) BY MOUTH THREE TIMES DAILY AS NEEDED, Disp: 90 tablet,  Rfl: 5   busPIRone  (BUSPAR ) 30 MG tablet, Take 1 tablet (30 mg total) by mouth 2 (two) times daily., Disp: 60 tablet, Rfl: 11   clomiPRAMINE  (ANAFRANIL ) 50 MG capsule, Take 1 capsule (50 mg total) by mouth at bedtime., Disp: 30 capsule, Rfl: 11   fluvoxaMINE  (LUVOX ) 100 MG tablet, Take 2 tablets (200 mg total) by mouth 2 (two) times daily., Disp: 120 tablet, Rfl: 11   moxifloxacin  (VIGAMOX ) 0.5 % ophthalmic solution, Place 1 drop into the left eye 3 (three) times daily. (Patient not taking: Reported on 08/21/2024), Disp: 3 mL, Rfl: 0   triamcinolone  cream (KENALOG ) 0.1 %, Apply 1 Application topically 2 (two) times daily. Avoid face, groin, underarms. (Patient not taking: Reported on 08/21/2024), Disp: 453 g, Rfl: 1 Medication Side Effects: none  Family Medical/ Social History: Changes?  no  MENTAL HEALTH EXAM:  Last menstrual period 04/26/2016.There is no height or weight on file to calculate BMI.  General Appearance: Casual, Neat and Well Groomed  Eye Contact:  Good  Speech:  Clear and Coherent and Normal Rate  Volume:  Normal  Mood:  Euthymic  Affect:  Congruent  Thought Process:  Goal Directed and Descriptions of Associations: Circumstantial  Orientation:  Full (Time, Place, and Person)  Thought Content: Logical   Suicidal Thoughts:  No  Homicidal Thoughts:  No  Memory:  WNL  Judgement:  Good  Insight:  Good  Psychomotor Activity:  Normal  Concentration:  Concentration: Good and Attention Span: Good  Recall:  Good  Fund of Knowledge: Good  Language: Good  Assets:  Communication Skills Desire for Improvement Financial Resources/Insurance Housing Leisure Time Physical Health Resilience Social Support Transportation Vocational/Educational  ADL's:  Intact  Cognition: WNL  Prognosis:  Good   Labs followed by PCP  DIAGNOSES:    ICD-10-CM   1. Mixed obsessional thoughts and acts  F42.2     2. Generalized anxiety disorder  F41.1     3. Major depression, recurrent,  full remission  F33.42       Receiving Psychotherapy: No   RECOMMENDATIONS:  PDMP was reviewed.  Last Xanax  filled 11/03/2024.     Continue Xanax  0.25 mg 1 p.o. every morning and 2 p.o. nightly routinely.   Continue BuSpar  30 mg, 1 p.o. twice daily. Continue clomipramine  50 mg, 1 p.o. nightly. Continue Luvox  100 mg, 2 p.o. twice daily. Return in 6 months.  Verneita Cooks, PA-C  "

## 2024-11-28 ENCOUNTER — Other Ambulatory Visit: Payer: Self-pay | Admitting: Physician Assistant

## 2024-12-15 ENCOUNTER — Encounter: Admitting: Internal Medicine

## 2025-05-26 ENCOUNTER — Ambulatory Visit: Admitting: Physician Assistant
